# Patient Record
Sex: Male | Born: 1975 | Race: White | Hispanic: No | Marital: Married | Smoking: Never smoker
Health system: Southern US, Community
[De-identification: ages and names within clinical notes are randomized; demographics above are authoritative.]

## PROBLEM LIST (undated history)

## (undated) DIAGNOSIS — K219 Gastro-esophageal reflux disease without esophagitis: Secondary | ICD-10-CM

## (undated) DIAGNOSIS — F419 Anxiety disorder, unspecified: Secondary | ICD-10-CM

## (undated) DIAGNOSIS — E669 Obesity, unspecified: Secondary | ICD-10-CM

## (undated) DIAGNOSIS — G629 Polyneuropathy, unspecified: Secondary | ICD-10-CM

## (undated) DIAGNOSIS — Z87442 Personal history of urinary calculi: Secondary | ICD-10-CM

## (undated) DIAGNOSIS — G4730 Sleep apnea, unspecified: Secondary | ICD-10-CM

## (undated) DIAGNOSIS — K760 Fatty (change of) liver, not elsewhere classified: Secondary | ICD-10-CM

## (undated) DIAGNOSIS — K649 Unspecified hemorrhoids: Secondary | ICD-10-CM

## (undated) DIAGNOSIS — E119 Type 2 diabetes mellitus without complications: Secondary | ICD-10-CM

## (undated) HISTORY — DX: Unspecified hemorrhoids: K64.9

## (undated) HISTORY — DX: Sleep apnea, unspecified: G47.30

## (undated) HISTORY — DX: Anxiety disorder, unspecified: F41.9

## (undated) HISTORY — PX: HERNIA REPAIR: SHX51

## (undated) HISTORY — DX: Type 2 diabetes mellitus without complications: E11.9

## (undated) HISTORY — DX: Obesity, unspecified: E66.9

## (undated) HISTORY — DX: Gastro-esophageal reflux disease without esophagitis: K21.9

## (undated) HISTORY — PX: ANAL FISSURE REPAIR: SHX2312

## (undated) HISTORY — PX: APPENDECTOMY: SHX54

---

## 2014-07-17 ENCOUNTER — Ambulatory Visit: Payer: Self-pay | Admitting: Nutrition

## 2014-09-01 ENCOUNTER — Encounter: Payer: Self-pay | Admitting: Nutrition

## 2016-08-23 DIAGNOSIS — Z6841 Body Mass Index (BMI) 40.0 and over, adult: Secondary | ICD-10-CM | POA: Diagnosis not present

## 2016-08-23 DIAGNOSIS — E119 Type 2 diabetes mellitus without complications: Secondary | ICD-10-CM | POA: Diagnosis not present

## 2017-04-06 DIAGNOSIS — Z6841 Body Mass Index (BMI) 40.0 and over, adult: Secondary | ICD-10-CM | POA: Diagnosis not present

## 2017-04-06 DIAGNOSIS — Z0000 Encounter for general adult medical examination without abnormal findings: Secondary | ICD-10-CM | POA: Diagnosis not present

## 2017-05-05 DIAGNOSIS — R110 Nausea: Secondary | ICD-10-CM | POA: Diagnosis not present

## 2017-05-05 DIAGNOSIS — Z79899 Other long term (current) drug therapy: Secondary | ICD-10-CM | POA: Diagnosis not present

## 2017-05-05 DIAGNOSIS — R1031 Right lower quadrant pain: Secondary | ICD-10-CM | POA: Diagnosis not present

## 2017-05-05 DIAGNOSIS — Z7984 Long term (current) use of oral hypoglycemic drugs: Secondary | ICD-10-CM | POA: Diagnosis not present

## 2017-05-05 DIAGNOSIS — F329 Major depressive disorder, single episode, unspecified: Secondary | ICD-10-CM | POA: Diagnosis not present

## 2017-05-05 DIAGNOSIS — Z87442 Personal history of urinary calculi: Secondary | ICD-10-CM | POA: Diagnosis not present

## 2017-05-05 DIAGNOSIS — Z6841 Body Mass Index (BMI) 40.0 and over, adult: Secondary | ICD-10-CM | POA: Diagnosis not present

## 2017-05-05 DIAGNOSIS — E119 Type 2 diabetes mellitus without complications: Secondary | ICD-10-CM | POA: Diagnosis not present

## 2017-05-05 DIAGNOSIS — K3580 Unspecified acute appendicitis: Secondary | ICD-10-CM | POA: Diagnosis not present

## 2017-07-06 DIAGNOSIS — S161XXA Strain of muscle, fascia and tendon at neck level, initial encounter: Secondary | ICD-10-CM | POA: Diagnosis not present

## 2017-07-06 DIAGNOSIS — M436 Torticollis: Secondary | ICD-10-CM | POA: Diagnosis not present

## 2017-07-06 DIAGNOSIS — Z6841 Body Mass Index (BMI) 40.0 and over, adult: Secondary | ICD-10-CM | POA: Diagnosis not present

## 2017-07-10 DIAGNOSIS — S134XXA Sprain of ligaments of cervical spine, initial encounter: Secondary | ICD-10-CM | POA: Diagnosis not present

## 2017-07-10 DIAGNOSIS — M546 Pain in thoracic spine: Secondary | ICD-10-CM | POA: Diagnosis not present

## 2017-07-11 DIAGNOSIS — M546 Pain in thoracic spine: Secondary | ICD-10-CM | POA: Diagnosis not present

## 2017-07-11 DIAGNOSIS — S134XXA Sprain of ligaments of cervical spine, initial encounter: Secondary | ICD-10-CM | POA: Diagnosis not present

## 2017-07-13 DIAGNOSIS — S134XXA Sprain of ligaments of cervical spine, initial encounter: Secondary | ICD-10-CM | POA: Diagnosis not present

## 2017-07-13 DIAGNOSIS — M546 Pain in thoracic spine: Secondary | ICD-10-CM | POA: Diagnosis not present

## 2017-07-19 DIAGNOSIS — M5412 Radiculopathy, cervical region: Secondary | ICD-10-CM | POA: Diagnosis not present

## 2017-07-19 DIAGNOSIS — Z6841 Body Mass Index (BMI) 40.0 and over, adult: Secondary | ICD-10-CM | POA: Diagnosis not present

## 2017-07-19 DIAGNOSIS — M542 Cervicalgia: Secondary | ICD-10-CM | POA: Diagnosis not present

## 2017-08-15 DIAGNOSIS — M5412 Radiculopathy, cervical region: Secondary | ICD-10-CM | POA: Diagnosis not present

## 2017-08-15 DIAGNOSIS — M542 Cervicalgia: Secondary | ICD-10-CM | POA: Diagnosis not present

## 2018-05-18 DIAGNOSIS — Z6841 Body Mass Index (BMI) 40.0 and over, adult: Secondary | ICD-10-CM | POA: Diagnosis not present

## 2018-05-18 DIAGNOSIS — Z0000 Encounter for general adult medical examination without abnormal findings: Secondary | ICD-10-CM | POA: Diagnosis not present

## 2018-05-18 DIAGNOSIS — F411 Generalized anxiety disorder: Secondary | ICD-10-CM | POA: Diagnosis not present

## 2018-05-18 DIAGNOSIS — E119 Type 2 diabetes mellitus without complications: Secondary | ICD-10-CM | POA: Diagnosis not present

## 2018-06-21 DIAGNOSIS — Z23 Encounter for immunization: Secondary | ICD-10-CM | POA: Diagnosis not present

## 2018-10-11 ENCOUNTER — Other Ambulatory Visit (HOSPITAL_COMMUNITY): Payer: Self-pay | Admitting: Surgery

## 2018-10-19 ENCOUNTER — Other Ambulatory Visit: Payer: Self-pay

## 2018-10-19 ENCOUNTER — Ambulatory Visit (HOSPITAL_COMMUNITY)
Admission: RE | Admit: 2018-10-19 | Discharge: 2018-10-19 | Disposition: A | Discharge status: Home / Self Care | Payer: BLUE CROSS/BLUE SHIELD | Source: Ambulatory Visit | Attending: Surgery | Admitting: Surgery

## 2018-10-19 DIAGNOSIS — Z01818 Encounter for other preprocedural examination: Secondary | ICD-10-CM | POA: Diagnosis not present

## 2018-10-19 DIAGNOSIS — K219 Gastro-esophageal reflux disease without esophagitis: Secondary | ICD-10-CM | POA: Diagnosis not present

## 2018-10-30 ENCOUNTER — Encounter: Payer: BLUE CROSS/BLUE SHIELD | Attending: Surgery | Admitting: Dietician

## 2018-10-30 ENCOUNTER — Encounter: Payer: Self-pay | Admitting: Dietician

## 2018-10-30 VITALS — Ht 71.0 in | Wt 301.9 lb

## 2018-10-30 DIAGNOSIS — E119 Type 2 diabetes mellitus without complications: Secondary | ICD-10-CM | POA: Insufficient documentation

## 2018-10-30 DIAGNOSIS — E669 Obesity, unspecified: Secondary | ICD-10-CM | POA: Diagnosis not present

## 2018-10-30 DIAGNOSIS — G4730 Sleep apnea, unspecified: Secondary | ICD-10-CM | POA: Insufficient documentation
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
## 2018-10-30 NOTE — Patient Instructions (Signed)
Begin working through the [employer] discussed today, starting with:   Limit caffeinated beverages (coffee, tea, soda, energy drinks, etc.) - start cutting back slowly, replacing them with water or flavored water (use sugar-free flavorings, such as Crystal Light for example)   See you next month for your 1st SWL appointment!

## 2018-11-16 DIAGNOSIS — Z6841 Body Mass Index (BMI) 40.0 and over, adult: Secondary | ICD-10-CM | POA: Diagnosis not present

## 2018-11-16 DIAGNOSIS — F411 Generalized anxiety disorder: Secondary | ICD-10-CM | POA: Diagnosis not present

## 2018-11-16 DIAGNOSIS — E119 Type 2 diabetes mellitus without complications: Secondary | ICD-10-CM | POA: Diagnosis not present

## 2018-11-30 ENCOUNTER — Other Ambulatory Visit: Payer: Self-pay

## 2018-11-30 ENCOUNTER — Encounter: Payer: Self-pay | Admitting: Dietician

## 2018-11-30 ENCOUNTER — Encounter: Payer: BLUE CROSS/BLUE SHIELD | Attending: Surgery | Admitting: Dietician

## 2018-11-30 VITALS — Wt 300.5 lb

## 2018-11-30 DIAGNOSIS — E669 Obesity, unspecified: Secondary | ICD-10-CM | POA: Diagnosis not present

## 2018-11-30 NOTE — Patient Instructions (Addendum)
Continue to work on the [employer], focusing on the following this month:   Limit carbonated beverages, including sodas, diet sodas, sparkling water, etc.   Try adding in a protein shake sometime during the day, such as for breakfast or a snack.
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
## 2018-12-11 ENCOUNTER — Telehealth: Payer: Self-pay

## 2018-12-11 NOTE — Telephone Encounter (Signed)
Due to current COVID 19 pandemic, our office is severely reducing in office visits for at least the next 2 weeks, in order to minimize the risk to our patients and healthcare providers.   I called pt and discussed this with him. He is agreeable to a virtual visit at his appt time on Thursday.  Pt understands that although there may be some limitations with this type of visit, we will take all precautions to reduce any security or privacy concerns.  Pt understands that this will be treated like an in office visit and we will file with pt's insurance, and there may be a patient responsible charge related to this service.  Pt's email is [email or web address]. Pt understands that the cisco webex software must be downloaded and operational on the device pt plans to use for the visit.  Pt reports that his neck size is 18'' because that is his collared shirt size.  Pt is 291 lbs and is 5'10.  Pt's meds, allergies, and PMH were updated.  Pt has never had a sleep study but does endorse snoring.  Epworth Sleepiness Scale 0= would never doze 1= slight chance of dozing 2= moderate chance of dozing 3= high chance of dozing  Sitting and reading: 1 Watching TV: 1 Sitting inactive in a public place (ex. Theater or meeting): 0 As a passenger in a car for an hour without a break: 0 Lying down to rest in the afternoon: 2 Sitting and talking to someone: 0 Sitting quietly after lunch (no alcohol): 1 In a car, while stopped in traffic: 0 Total: 5  FSS: 43

## 2018-12-13 ENCOUNTER — Encounter: Payer: Self-pay | Admitting: Neurology

## 2018-12-13 ENCOUNTER — Ambulatory Visit (INDEPENDENT_AMBULATORY_CARE_PROVIDER_SITE_OTHER): Payer: BLUE CROSS/BLUE SHIELD | Admitting: Neurology

## 2018-12-13 ENCOUNTER — Other Ambulatory Visit: Payer: Self-pay

## 2018-12-13 DIAGNOSIS — R351 Nocturia: Secondary | ICD-10-CM

## 2018-12-13 DIAGNOSIS — R519 Headache, unspecified: Secondary | ICD-10-CM

## 2018-12-13 DIAGNOSIS — R0681 Apnea, not elsewhere classified: Secondary | ICD-10-CM

## 2018-12-13 DIAGNOSIS — R0683 Snoring: Secondary | ICD-10-CM

## 2018-12-13 DIAGNOSIS — Z6841 Body Mass Index (BMI) 40.0 and over, adult: Secondary | ICD-10-CM

## 2018-12-13 DIAGNOSIS — R51 Headache: Secondary | ICD-10-CM

## 2018-12-13 NOTE — Progress Notes (Signed)
Huston Foley, MD, PhD [facility] [address], [address] [address] [place], [place] [address]   Virtual Visit via Video Note on 12/13/2018:  I connected with Malik Ramos on 12/13/18 at  4:00 PM EDT by a video enabled telemedicine application and verified that I am speaking with the correct person using two identifiers.   I discussed the limitations of evaluation and management by telemedicine and the availability of in person appointments. The patient expressed understanding and agreed to proceed.  History of Present Illness:  Malik Ramos is a 43 -year-old right-handed gentleman with an underlying medical history of type 2 diabetes, neuropathy, and morbid obesity with a BMI of over 40, with whom I am conducting a virtual, video based new patient visit via WebEx today to discuss his sleep disorder, in particular, concern for underlying obstructive sleep apnea. The patient is unaccompanied today and joins via I pad from home. The patient is referred by Dr. Twana First, general surgeon and is being evaluated for bariatric surgery. I reviewed Dr. Eustaquio Boyden office note from 10/05/2018. His Epworth sleepiness score is 5 out of 24, fatigue severity score is 43 out of 63. The most recent vital signs on file were from 10/05/2018 from Dr. Eustaquio Boyden office note: Blood pressure 158/88, pulse of 111, weight 309 pounds with a BMI of 43.73. Weight on 11/30/2018 was 300.5 pounds for a BMI of 41.91. Neck circumference by self report: 18.5 inches.  He has had witnessed apneas previously per wifes report when he weighed more. His maximum weight was 340 pounds. He is not aware of any family history of OSA, denies any leg edema, denies restless leg symptoms. He has nocturia about once per average night. He does snore. He has had rare morning headaches. He had no tonsillectomy. He drinks caffeine in the form of soda, diet soda typically, 3-4 16 ounce bottles per day on average. He is a  nonsmoker and does not currently utilize any alcohol. 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 Bedtime is generally between 10 and 10:30, rise time around 7:30. They have 2 cats in the household, not in their bedroom typically. He does not have a TV in the bedroom. His most recent A1c was around 11, he has restarted another diabetes medication and has a checkup pending with primary care.  His Past Medical History Is Significant For: Past Medical History:  Diagnosis Date   Anxiety    GERD (gastroesophageal reflux disease)    Hemorrhoids    Obesity    Obesity    Sleep apnea    Type 2 diabetes mellitus (HCC)     His Past Surgical History Is Significant For: Past Surgical History:  Procedure Laterality Date   APPENDECTOMY      His Family History Is Significant For: Family History  Problem Relation Age of Onset   Hypertension Mother     His Social History Is Significant For: Social History   Socioeconomic History   Marital status: Married    Spouse name: Not on file   Number of children: 4   Years of education: Not on file   Highest education level: Not on file  Occupational History   Not on file  Social Needs   Financial resource strain: Not on file   Food insecurity:    Worry: Not on file    Inability: Not on file   Transportation needs:  Medical: Not on file    Non-medical: Not on file  Tobacco Use   Smoking status: Never Smoker   Smokeless tobacco: Never Used  Substance and Sexual Activity   Alcohol use: Never    Frequency: Never   Drug use: Not on file   Sexual activity: Not on file  Lifestyle   Physical activity:    Days per week: Not on file    Minutes per session: Not on file   Stress: Not on file  Relationships   Social connections:    Talks on phone: Not on file    Gets together: Not on file    Attends religious service: Not on file    Active member of club or  organization: Not on file    Attends meetings of clubs or organizations: Not on file    Relationship status: Not on file  Other Topics Concern   Not on file  Social History Narrative   3-4 diet sodas daily   R handed    His Allergies Are:  No Known Allergies:   His Current Medications Are:  Outpatient Encounter Medications as of 12/13/2018  Medication Sig   busPIRone (BUSPAR) 5 MG tablet Take 5 mg by mouth 2 (two) times daily.   lisinopril (PRINIVIL,ZESTRIL) 2.5 MG tablet Take 2.5 mg by mouth daily.   metFORMIN (GLUCOPHAGE-XR) 500 MG 24 hr tablet Take 500 mg by mouth 2 (two) times daily.   OMEPRAZOLE PO Take by mouth.   PARoxetine (PAXIL) 40 MG tablet Take 40 mg by mouth daily.   No facility-administered encounter medications on file as of 12/13/2018.   :   Review of Systems:  Out of a complete 14 point review of systems, all are reviewed and negative with the exception of these symptoms as listed below:  Observations/Objective: The most recent vital signs available for my review are from 10/05/2018: Blood pressure 158/88, pulse 111, temperature 97.8, weight 309 pounds for BMI of 43.73. Patient is pleasant, conversant, no acute distress. Neck circumference by self report is 18.5 inches. Airway examination reveals a Mallampati class II, tonsils are not fully visualized but seem to be on the smaller side, uvula could be on the larger side, tongue protrudes centrally, palate elevates symmetrically. Face is symmetric, speech is clear and not dysarthric, no voice tremor, no lip, neck or jaw tremor, extraocular movements are grossly intact. He is able to use his upper extremities freely and without problems, fine motor skills generally preserved.  Assessment and Plan: In summary, Malik Ramos is a very pleasant 43 y.o.-year old male with an underlying medical history of type 2 diabetes, neuropathy, and morbid obesity with a BMI of over 40, with whom I am conducting a virtual, video  based new patient visit via WebEx today to discuss his sleep disorder, in particular, concern for underlying obstructive sleep apnea. His history and physical examination does raise concern for underlying obstructive sleep apnea. He is being evaluated for bariatric surgery. I talked to the patient about the diagnosis of OSA, its prognosis and treatment options. We talked about the risks and ramifications of untreated OSA, in particular with respect to developing cardiovascular and cerebrovascular complications down the road. Realistically speaking a AutoPap her CPAP would be the treatment of choice right now. He would be willing to consider treatment with a positive airway pressure device. I suggest we proceed with home sleep testing and our sleep lab should be in touch soon regarding setting up home testing. I answered all  his questions today and the patient was in agreement. I plan to see him back after testing is completed.  Huston Foley, MD, PhD  Follow Up Instructions:  1. Home sleep testing.  2. Consider AutoPap therapy if OSA is confirmed. 3. Follow-up post testing.   I discussed the assessment and treatment plan with the patient. The patient was provided an opportunity to ask questions and all were answered. The patient agreed with the plan and demonstrated an understanding of the instructions.   The patient was advised to call back or seek an in-person evaluation if the symptoms worsen or if the condition fails to improve as anticipated.  I provided 30 minutes of non-face-to-face time during this encounter, including Electronic and paper chart review.   Huston Foley, MD

## 2018-12-13 NOTE — Patient Instructions (Signed)
Given verbally, during today's virtual video-based encounter, with verbal feedback received.   

## 2018-12-19 ENCOUNTER — Ambulatory Visit (INDEPENDENT_AMBULATORY_CARE_PROVIDER_SITE_OTHER): Payer: BLUE CROSS/BLUE SHIELD | Admitting: Neurology

## 2018-12-19 ENCOUNTER — Other Ambulatory Visit: Payer: Self-pay

## 2018-12-19 DIAGNOSIS — G4733 Obstructive sleep apnea (adult) (pediatric): Secondary | ICD-10-CM

## 2018-12-19 DIAGNOSIS — Z6841 Body Mass Index (BMI) 40.0 and over, adult: Secondary | ICD-10-CM

## 2018-12-19 DIAGNOSIS — R351 Nocturia: Secondary | ICD-10-CM

## 2018-12-19 DIAGNOSIS — R0683 Snoring: Secondary | ICD-10-CM

## 2018-12-19 DIAGNOSIS — R519 Headache, unspecified: Secondary | ICD-10-CM

## 2018-12-19 DIAGNOSIS — R0681 Apnea, not elsewhere classified: Secondary | ICD-10-CM

## 2018-12-19 DIAGNOSIS — R51 Headache: Secondary | ICD-10-CM

## 2019-01-07 ENCOUNTER — Telehealth: Payer: Self-pay

## 2019-01-07 NOTE — Progress Notes (Signed)
Patient referred by Dr. Fredricka Bonine, seen virtually by me on 12/13/18, HST on 12/27/18.    Please call and notify the patient that the recent home sleep test showed obstructive sleep apnea in the moderate severe range. While I recommend treatment for this in the form CPAP, we are not yet bringing patients in for in-lab testing for CPAP titration studies, due to the virus pandemic; therefore, I suggest we start him on autoPAP at home, which means, that we don't have to bring him in for a sleep study with CPAP, but will let him start using an autoPAP machine at home, through a DME company (of patient's choice, or as per insurance requirement, as per in [employer], if there are such restrictions, depending on insurance carrier). The DME representative will educate the patient on how to use the machine, how to put the mask on, etc. I have placed an order in the chart. Please send referral, talk to patient, send report to referring MD. We will need a FU in sleep clinic for 10 weeks post-PAP set up, please arrange that with me or one of our NPs.  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 For concerns and questions about how to clean the PAP machine and the supplies and how frequently to change the hose, mask and filters, etc., patient can call the DME company, for more information, education and troubleshooting. Especially in the current situation, I recommend, patients be extra mindful about hand hygiene, handling the PAP equipment only with clean hands, wipe the mask daily, keep little one and four-legged companions (and any other pets for that matter) away from the machine and mask at all times.    Thanks,    Huston Foley, MD, PhD [facility])

## 2019-01-07 NOTE — Telephone Encounter (Signed)
I called pt. I advised pt that Dr. Frances Furbish reviewed their sleep study results and found that pt has moderate to severe osa. Dr. Frances Furbish recommends that pt start an auto pap at home since we are not able to do cpap titrations in light of virus pandemic. I reviewed PAP compliance expectations with the pt. Pt is agreeable to starting an auto-PAP. I advised pt that an order will be sent to a DME, [facility], and [facility] will call the pt within about one week after they file with the pt's insurance. [facility] will show the pt how to use the machine, fit for masks, and troubleshoot the auto-PAP if needed. A follow up appt was made for insurance purposes with Dr. Frances Furbish on 04/02/19 at 3:00pm. Pt verbalized understanding to arrive 15 minutes early and bring their auto-PAP. A letter with all of this information in it will be mailed to the pt as a reminder. I verified with the pt that the address we have on file is correct. Pt verbalized understanding of results. Pt had no questions at this time but was encouraged to call back if questions arise. I have sent the order to [facility] and have received confirmation that they have received the order.

## 2019-01-07 NOTE — Telephone Encounter (Signed)
-----   Message from Huston Foley, MD sent at 01/07/2019  8:42 AM EDT ----- Patient referred by Dr. Fredricka Bonine, seen virtually by me on 12/13/18, HST on 12/27/18.    Please call and notify the patient that the recent home sleep test showed obstructive sleep apnea in the moderate severe range. While I recommend treatment for this in the form CPAP, we are not yet bringing patients in for in-lab testing for CPAP titration studies, due to the virus pandemic; therefore, I suggest we start him on autoPAP at home, which means, that we don't have to bring him in for a sleep study with CPAP, but will let him start using an autoPAP machine at home, through a DME company (of patient's choice, or as per insurance requirement, as per in [employer], if there are such restrictions, depending on insurance carrier). The DME representative will educate the patient on how to use the machine, how to put the mask on, etc. I have placed an order in the chart. Please send referral, talk to patient, send report to referring MD. We will need a FU in sleep clinic for 10 weeks post-PAP set up, please arrange that with me or one of our NPs.  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 For concerns and questions about how to clean the PAP machine and the supplies and how frequently to change the hose, mask and filters, etc., patient can call the DME company, for more information, education and troubleshooting. Especially in the current situation, I recommend, patients be extra mindful about hand hygiene, handling the PAP equipment only with clean hands, wipe the mask daily, keep little one and four-legged companions (and any other pets for that  matter) away from the machine and mask at all times.     Thanks,   Huston Foley, MD, PhD [facility])

## 2019-01-07 NOTE — Addendum Note (Signed)
Addended by: Huston Foley on: 01/07/2019 08:42 AM   Modules accepted: Orders

## 2019-01-07 NOTE — Procedures (Signed)
Patient Information     First Name: Malik BootyJoshua Last Name: [name] ID: [id number]  Birth Date: Sep 01, 1976 Age: 43 Gender: Male  Referring Doctor: Lovey NewcomerBoyd, William S, PA BMI: 42.0 (W=300 lb, H=5' 11'')  Reading Doctor: Neck Circ: Huston FoleySaima Marieli Rudy, MD  18.5 Epworth:  5/24 FSS: 61/63  Sleep Study Information    Study Date: Dec 27, 2018 S/H/A Version: 5.1.76.4 / 4.1.1531 / 5376  History: 43 year old man with a history of type 2 diabetes, neuropathy, and morbid obesity, who is at risk for obstructive sleep apnea. The patient is being evaluated for bariatric surgery.            Diagnosis: OSA             Recommendations:  This home sleep test demonstrates moderate obstructive sleep apnea with a total AHI of 21/hour and O2 nadir of 87%. Treatment with positive airway pressure - in the form of CPAP - is recommended. This will require, ideally, require a full night CPAP titration study for proper treatment settings, O2 monitoring and mask fitting. Based on the severity of the sleep disordered breathing, an attended titration study is indicated. However, under the current circumstances (i.e. the COVID-19 pandemic), in order to ensure ongoing good care and for the safety of the patient, she will be advised to proceed with an autoPAP titration/trial at home. A proper titration study with CPAP may be helpful or needed down the road, when considered safe. Please note, that untreated obstructive sleep apnea may carry additional perioperative morbidity. Patients with significant obstructive sleep apnea should receive perioperative PAP therapy and the surgeons and particularly the anesthesiologist should be informed of the diagnosis and the severity of the sleep disordered breathing. Patient will be reminded regarding compliance with his PAP machine and to be mindful of cleanliness with the equipment and timely with supply changes (i.e. changing filter, mask, hose, humidifier chamber on an ongoing basis as recommended, and  cleaning parts that touch the face and nose daily, etc). The patient should be cautioned not to drive, work at heights, or operate dangerous or heavy equipment when tired or sleepy. Review and reiteration of good sleep hygiene measures should be pursued with any patient. Other causes of the patient's symptoms, including circadian rhythm disturbances, an underlying mood disorder, medication effect and/or an underlying medical problem cannot be ruled out based on this test. Clinical correlation is recommended. The patient and his referring provider will be notified of the test results. The patient will [facility], either for a face-to-face or virtual visit, whichever feasible and recommended at the time.  I certify that I have reviewed the raw data recording prior to the issuance of this report in accordance with the standards of the American Academy of Sleep Medicine (AASM).  Huston FoleySaima Hanadi Stanly, MD, PhD [facility] Diplomat, ABPN (Neurology and Sleep)                Sleep Summary  Oxygen Saturation Statistics   Start Study Time: End Study Time: Total Recording Time:  10:18:27 PM 8:18:27 AM 10 h, 0 min  Total Sleep Time % REM of Sleep Time:  8 h, 32 min  18.4%    Mean: 96  Minimum: 87  Maximum: 99   Mean of Desaturations [place]irs (%):92     Oxygen Desaturation %:   4-9 10-20 >20 Total  Events Number Total    83  5 94.3 5.7  0 0.0  88 100.0  Oxygen Saturation: <90 <=88 <85 <80 <70  Duration (minutes): Sleep % 1.0 0.2  0.4 0.0  0.1 0.0 0.0 0.0 0.0 0.0     Respiratory Indices      Total Events REM NREM All Night  pRDI:  223  pAHI:  179 ODI:  88  pAHIc:  10  % CSR: 0.0 34.5 32.0 16.0 1.9 24.3 18.6 9.1 1.0 26.2 21.0 10.3 1.2       Pulse Rate Statistics during Sleep (BPM)      Mean: 73 Minimum: 54 Maximum: 107    Indices are calculated using technically valid sleep time of  8 hrs, 30 min. pRDI/pAHI  are calculated using oxi desaturations ? 3%  Body Position Statistics  Position Supine Prone Right Left Non-Supine  Sleep (min) 67.5 47.0 307.5 90.5 445.0  Sleep % 13.2 9.2 60.0 17.7 86.8  pRDI 29.4 23.1 24.9 29.9 25.7  pAHI 25.0 14.1 19.0 28.6 20.4  ODI 12.5 5.1 8.6 17.3 10.0     Snoring Statistics Snoring Level (dB) >40 >50 >60 >70 >80 >Threshold (45)  Sleep (min) 281.2 91.9 8.8 0.0 0.0 156.8  Sleep % 54.9 17.9 1.7 0.0 0.0 30.6    Mean: 44 dB Sleep Stages Chart                                pAHI=21.0                                             Mild              Moderate                    Severe                                                    5              15                    30* Reference values are according to AASM guidelines

## 2019-01-10 ENCOUNTER — Encounter: Payer: BLUE CROSS/BLUE SHIELD | Attending: Surgery | Admitting: Skilled Nursing Facility1

## 2019-01-10 ENCOUNTER — Other Ambulatory Visit: Payer: Self-pay

## 2019-01-10 DIAGNOSIS — E119 Type 2 diabetes mellitus without complications: Secondary | ICD-10-CM

## 2019-01-10 DIAGNOSIS — E669 Obesity, unspecified: Secondary | ICD-10-CM | POA: Insufficient documentation
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
## 2019-02-07 ENCOUNTER — Ambulatory Visit: Payer: BLUE CROSS/BLUE SHIELD | Admitting: Skilled Nursing Facility1

## 2019-02-13 ENCOUNTER — Encounter: Payer: BC Managed Care – PPO | Attending: Surgery | Admitting: Skilled Nursing Facility1

## 2019-02-13 ENCOUNTER — Other Ambulatory Visit: Payer: Self-pay

## 2019-02-13 DIAGNOSIS — E669 Obesity, unspecified: Secondary | ICD-10-CM | POA: Insufficient documentation
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
## 2019-02-13 NOTE — Patient Instructions (Signed)
-  Log everything you eat and drink and the amount: if you bite it write, if you drink it, ink it and look back for connections with blood sugars   -

## 2019-03-13 ENCOUNTER — Other Ambulatory Visit: Payer: Self-pay

## 2019-03-13 ENCOUNTER — Encounter: Payer: BC Managed Care – PPO | Attending: Surgery | Admitting: Skilled Nursing Facility1

## 2019-03-13 DIAGNOSIS — E119 Type 2 diabetes mellitus without complications: Secondary | ICD-10-CM

## 2019-03-13 DIAGNOSIS — E669 Obesity, unspecified: Secondary | ICD-10-CM | POA: Diagnosis not present
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
## 2019-03-14 DIAGNOSIS — F509 Eating disorder, unspecified: Secondary | ICD-10-CM | POA: Diagnosis not present

## 2019-03-25 DIAGNOSIS — E119 Type 2 diabetes mellitus without complications: Secondary | ICD-10-CM | POA: Diagnosis not present

## 2019-03-25 DIAGNOSIS — K219 Gastro-esophageal reflux disease without esophagitis: Secondary | ICD-10-CM | POA: Diagnosis not present

## 2019-03-25 DIAGNOSIS — R945 Abnormal results of liver function studies: Secondary | ICD-10-CM | POA: Diagnosis not present

## 2019-03-27 DIAGNOSIS — F411 Generalized anxiety disorder: Secondary | ICD-10-CM | POA: Diagnosis not present

## 2019-03-27 DIAGNOSIS — Z6841 Body Mass Index (BMI) 40.0 and over, adult: Secondary | ICD-10-CM | POA: Diagnosis not present

## 2019-03-27 DIAGNOSIS — E782 Mixed hyperlipidemia: Secondary | ICD-10-CM | POA: Diagnosis not present

## 2019-03-27 DIAGNOSIS — E119 Type 2 diabetes mellitus without complications: Secondary | ICD-10-CM | POA: Diagnosis not present

## 2019-03-28 ENCOUNTER — Ambulatory Visit: Payer: BLUE CROSS/BLUE SHIELD | Admitting: Psychiatry

## 2019-04-02 ENCOUNTER — Ambulatory Visit: Payer: Self-pay | Admitting: Neurology

## 2019-04-15 ENCOUNTER — Encounter: Payer: BC Managed Care – PPO | Attending: Surgery | Admitting: Skilled Nursing Facility1

## 2019-04-15 ENCOUNTER — Other Ambulatory Visit: Payer: Self-pay

## 2019-04-15 ENCOUNTER — Ambulatory Visit: Payer: BC Managed Care – PPO | Admitting: Skilled Nursing Facility1

## 2019-04-15 DIAGNOSIS — E669 Obesity, unspecified: Secondary | ICD-10-CM | POA: Insufficient documentation

## 2019-04-15 DIAGNOSIS — E119 Type 2 diabetes mellitus without complications: Secondary | ICD-10-CM
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
## 2019-05-14 ENCOUNTER — Other Ambulatory Visit: Payer: Self-pay

## 2019-05-14 ENCOUNTER — Encounter: Payer: BC Managed Care – PPO | Attending: Surgery | Admitting: Skilled Nursing Facility1

## 2019-05-14 DIAGNOSIS — E669 Obesity, unspecified: Secondary | ICD-10-CM | POA: Diagnosis not present
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
## 2019-06-07 ENCOUNTER — Ambulatory Visit: Payer: Self-pay | Admitting: Surgery

## 2019-06-07 NOTE — H&P (Signed)
Surgical H&P  CC: obesity  HPI: Follows up today for surgical management of morbid obesity. We initially met in January of this year. He reports no major changes in his health. He has completed 6 months of supervised weight loss and is ready to proceed with surgery. He reports that his meetings with the dietitians have been very helpful for him, and states that he has started to build a framework mentally as far as developing new habits and behavior patterns that can help him sustain success in the future. He has completed the remainder of the bariatric pathway without any obstacles identified. Approved by psychology. Upper GI showed no hiatal hernia although did demonstrate mild reflux. Chest x-ray normal. Labwork unremarkable except for hemoglobin A1c of 8.6 which is aware. He did undergo evaluation for sleep apnea and does have moderate to severe obstructive sleep apnea.  Initial visit 10/08/18: 43yo male presents to discuss bariatric surgery. He has been overweight for essentially his entire life. When he was in his 35s, he weighed approximately 225 pounds but was very athletic. He has continued to gain weight over the years. He has tried numerous different diets and notes that he is successfully stopped losing some weight, however after the diet and see regained that weight that he lost plus more. He is affected by his obesity and many ways- he does have type 2 diabetes which is well controlled with metformin. He does have reflux for which he takes over-the-counter medication. 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 He does admit that he has a significant rent emotional relationship to food, he states that this is his left leg which, in a big part of how he enjoys his life. He does express that he understands he will need to let that go and change  that behavior although admittedly will be difficult.  He is married, with 4 children ages 17, 18, 52, 27. He feels that he has a good support system to go through bariatric surgery. Works as Copy at [employer]. Denies tobacco or drug use, drinks very occasionally a small amount.  Wt 309.13lb, BMI 43.73. He is a good candidate for sleeve gastrectomy. We discussed the surgery including technical aspects, the risks of bleeding, infection, pain, scarring, injury to intra-abdominal structures, staple line leak or abscess, chronic abdominal pain or nausea, worsened GERD which on very rare occasions can require conversion to bypass, DVT/PE, pneumonia, heart attack, stroke, death, failure to reach weight loss goals and weight regain, hernia. Discussed the typical pre-, peri-, and postoperative course. Discussed the importance of lifelong behavioral changes to combat the chronic and relapsing disease which is obesity. It is very encouraging that he already understands and acknowledges and emotional relationship with food and is willing to work on changing that. We discussed starting by observing his behaviors and thoughts and noticing where he can start making changes. He had several very insightful questions which were answered to his satisfaction. Will start him on the bariatric pathway.  No Known Allergies  Past Medical History:  Diagnosis Date  . Anxiety   . GERD (gastroesophageal reflux disease)   . Hemorrhoids   . Obesity   . Obesity   . Sleep apnea   . Type 2 diabetes mellitus ([place])     Past Surgical History:  Procedure  Laterality Date  . APPENDECTOMY      Family History  Problem Relation Age of Onset  . Hypertension Mother     Social History   Socioeconomic History  . Marital status: Married    Spouse name: Not on file  . Number of children: 4  . Years of education: Not on file  . Highest education level: Not on file  Occupational History  . Not on  file  Social Needs  . Financial resource strain: Not on file  . Food insecurity    Worry: Not on file    Inability: Not on file  . Transportation needs    Medical: Not on file    Non-medical: Not on file  Tobacco Use  . Smoking status: Never Smoker  . Smokeless tobacco: Never Used  Substance and Sexual Activity  . Alcohol use: Never    Frequency: Never  . Drug use: Not on file  . Sexual activity: Not on file  Lifestyle  . Physical activity    Days per week: Not on file    Minutes per session: Not on file  . Stress: Not on file  Relationships  . Social Herbalist on phone: Not on file    Gets together: Not on file    Attends religious service: Not on file    Active member of club or organization: Not on file    Attends meetings of clubs or organizations: Not on file    Relationship status: Not on file  Other Topics Concern  . Not on file  Social History Narrative   3-4 diet sodas daily   R handed    Current Outpatient Medications on File Prior to Visit  Medication Sig Dispense Refill  . busPIRone (BUSPAR) 5 MG tablet Take 5 mg by mouth 2 (two) times daily.    Marland Kitchen lisinopril (PRINIVIL,ZESTRIL) 2.5 MG tablet Take 2.5 mg by mouth daily.    . metFORMIN (GLUCOPHAGE-XR) 500 MG 24 hr tablet Take 500 mg by mouth 2 (two) times daily.    Marland Kitchen OMEPRAZOLE PO Take by mouth.    Marland Kitchen PARoxetine (PAXIL) 40 MG tablet Take 40 mg by mouth daily.     No current facility-administered medications on file prior to visit.     Review of Systems: a complete, 10pt review of systems was completed with pertinent positives and negatives as documented in the HPI  Physical Exam: Vitals  Weight: 306.2 lb Height: 70in Body Surface Area: 2.5 m Body Mass Index: 43.93 kg/m  Temp.: 97.66F  Pulse: 114 (Regular)  P.OX: 93% (Room air) BP: 150/100 (Sitting, Left Arm, Standard)  Alert and well-appearing. Unlabored respirations. Extraocular movements intact.   No flowsheet data  found.  No flowsheet data found.  No results found for: INR, PROTIME  Imaging: No results found.   A/P: MORBID OBESITY (E66.01) Story: Remains an excellent candidate for sleeve gastrectomy and seems to have made good use of the counseling he received while meeting with the dietitians during the period of supervised weight loss. We went over again today the typical perioperative course and the risks of surgery. He had several insightful questions which were answered to his satisfaction. He has an excellent support network and is ready to proceed.   Romana Juniper, MD [facility], [place] Pager [phone]

## 2019-06-25 DIAGNOSIS — Z23 Encounter for immunization: Secondary | ICD-10-CM | POA: Diagnosis not present

## 2019-07-01 ENCOUNTER — Other Ambulatory Visit: Payer: Self-pay

## 2019-07-01 ENCOUNTER — Encounter: Payer: Self-pay | Admitting: Dietician

## 2019-07-01 ENCOUNTER — Encounter: Payer: BC Managed Care – PPO | Attending: Surgery | Admitting: Dietician

## 2019-07-01 DIAGNOSIS — E669 Obesity, unspecified: Secondary | ICD-10-CM | POA: Diagnosis not present

## 2019-07-01 NOTE — Progress Notes (Signed)
Pre-Operative Nutrition Class   Appt Start Time: 8:15am     End Time: 9:30am   Patient was seen on 07/01/2019 for Pre-Operative Bariatric Surgery Education at [facility].    Surgery date: 07/22/2019  Surgery type: Sleeve    Start weight at [facility]: 301.7 lbs (date: 10/30/2018) Weight today: 311 lbs BMI: 43.4 kg/m2   Samples Given per MNT Protocol (pt educated on appropriate usage) Multivitamin: Bariatric Advantage Lot #[id number] Exp: 09/2020   Calcium: Bariatric Fusion    Protein Drink: Protein2O Lot #[id number] CCP 0230  Exp: 10/29/2020   The following the learning objectives were met by the patient during this course:  Identify Pre-Op Dietary Goals and will begin 2 weeks pre-operatively  Identify appropriate sources of fluids and proteins   State protein recommendations and appropriate sources pre and post-operatively  Identify Post-Operative Dietary Goals and will follow for 2 weeks post-operatively  Identify appropriate multivitamin and calcium sources  Describe the need for physical activity post-operatively and will follow MD recommendations  State when to call healthcare provider regarding medication questions or post-operative complications   Handouts given include:  Pre-Op Bariatric Surgery Diet Handout  Protein Shake Handout  Post-Op Bariatric Surgery Nutrition Handout  [facility] Program Information Flyer  Support Group Information Flyer  [facility] Bariatric Supplements Price List   Follow-Up Plan: Patient will follow-up at [facility] 2 weeks post operatively for diet advancement per MD.

## 2019-07-17 NOTE — Patient Instructions (Addendum)
DUE TO COVID-19 ONLY ONE VISITOR IS ALLOWED TO COME WITH YOU AND STAY IN THE WAITING ROOM ONLY DURING PRE OP AND PROCEDURE DAY OF SURGERY. THE 1 VISITOR MAY VISIT WITH YOU AFTER SURGERY IN YOUR PRIVATE ROOM DURING VISITING HOURS ONLY!  YOU NEED TO HAVE A COVID 19 TEST ON Today, Immediately after pre op appointment, THIS TEST MUST BE DONE BEFORE SURGERY, COME  [address], [place]  , [id number].  [facility]) ONCE YOUR COVID TEST IS COMPLETED, PLEASE BEGIN THE QUARANTINE INSTRUCTIONS AS OUTLINED IN YOUR HANDOUT.                Malik Ramos     Your procedure is scheduled on: Tuesday 07/23/2019   Report to [facility] Main  Entrance    Report to admitting at 0715  AM   How to Manage Your Diabetes Before and After Surgery  Why is it important to control my blood sugar before and after surgery? . Improving blood sugar levels before and after surgery helps healing and can limit problems. . A way of improving blood sugar control is eating a healthy diet by: o  Eating less sugar and carbohydrates o  Increasing activity/exercise o  Talking with your doctor about reaching your blood sugar goals . High blood sugars (greater than 180 mg/dL) can raise your risk of infections and slow your recovery, so you will need to focus on controlling your diabetes during the weeks before surgery. . Make sure that the doctor who takes care of your diabetes knows about your planned surgery including the date and location.  How do I manage my blood sugar before surgery? . Check your blood sugar at least 4 times a day, starting 2 days before surgery, to make sure that the level is not too high or low. o Check your blood sugar the morning of your surgery when you wake up and every 2 hours until you get to the Short Stay unit. . If your blood sugar is less than 70 mg/dL, you will need to treat for low blood sugar: o Do not take insulin. o Treat a low blood sugar (less than 70  mg/dL) with  cup of clear juice (cranberry or apple), 4 glucose tablets, OR glucose gel. o Recheck blood sugar in 15 minutes after treatment (to make sure it is greater than 70 mg/dL). If your blood sugar is not greater than 70 mg/dL on recheck, call [phone] for further instructions. . Report your blood sugar to the short stay nurse when you get to Short Stay.  . If you are admitted to the hospital after surgery: o Your blood sugar will be checked by the staff and you will probably be given insulin after surgery (instead of oral diabetes medicines) to make sure you have good blood sugar levels. o The goal for blood sugar control after surgery is 80-180 mg/dL.   WHAT DO I DO ABOUT MY DIABETES MEDICATION?        The day before surgery, Take Metformin (Glucophage-XR) as prescribed!  . Do not take oral diabetes medicines (pills) the morning of surgery.        Call this number if you have problems the morning of surgery [phone]    NO SOLID FOOD AFTER 600 PM THE NIGHT BEFORE YOUR SURGERY.   MAY HAVE LIQUIDS UNTIL 6:15 AM DAY OF SURGERY   CLEAR LIQUID DIET  Foods Allowed  Foods Excluded  Water, Black Coffee and tea, regular and decaf                             liquids that you cannot  Plain Jell-O in any flavor  (No red)                                           see through such as: Fruit ices (not with fruit pulp)                                     milk, soups, orange juice  Iced Popsicles (No red)                                    All solid food Carbonated beverages, regular and diet                                    Apple juices Sports drinks like Gatorade (No red) Lightly seasoned clear broth or consume(fat free) Sugar, honey syrup  Sample Menu Breakfast                                Lunch                                     Supper Cranberry juice                    Beef broth                             Chicken broth Jell-O                                     Grape juice                           Apple juice Coffee or tea                        Jell-O                                      Popsicle                                                Coffee or tea                        Coffee or tea   MORNING OF SURGERY DRINK:   DRINK 1 G2 drink BEFORE YOU LEAVE HOME, DRINK ALL OF THE  G2 DRINK AT ONE TIME.    YOU MAY DRINK CLEAR FLUIDS. THE G2 DRINK YOU DRINK BEFORE YOU LEAVE HOME WILL BE THE LAST FLUIDS YOU DRINK BEFORE SURGERY.   Take these medicines the morning of surgery with A SIP OF WATER: none   DO NOT TAKE ANY DIABETIC MEDICATIONS DAY OF YOUR SURGERY!                               You may not have any metal on your body including hair pins and              piercings  Do not wear jewelry, make-up, lotions, powders or perfumes, deodorant                           Men may shave face and neck.   Do not bring valuables to the hospital. [facility] IS NOT             RESPONSIBLE   FOR VALUABLES.  Contacts, dentures or bridgework may not be worn into surgery.  Leave suitcase in the car. After surgery it may be brought to your room.   PAIN IS EXPECTED AFTER SURGERY AND WILL NOT BE COMPLETELY ELIMINATED. AMBULATION AND TYLENOL WILL HELP REDUCE  INCISIONAL AND GAS PAIN. MOVEMENT IS KEY!   YOU ARE EXPECTED TO BE OUT OF BED WITHIN 4 HOURS OF ADMISSION TO YOUR PATIENT ROOM.  SITTING IN THE RECLINER THROUGHOUT THE DAY IS IMPORTANT FOR DRINKING FLUIDS AND MOVING GAS THROUGHOUT THE GI TRACT.  COMPRESSION STOCKINGS SHOULD BE [facility] UNLESS YOU ARE WALKING.   INCENTIVE SPIROMETER SHOULD BE USED EVERY HOUR WHILE AWAKE TO DECREASE POST-OPERATIVE COMPLICATIONS SUCH AS PNEUMONIA.  WHEN DISCHARGED HOME, IT IS IMPORTANT TO CONTINUE TO WALK EVERY HOUR AND USE THE INCENTIVE SPIROMETER EVERY HOUR.            BRUSH YOUR TEETH MORNING OF SURGERY AND RINSE YOUR MOUTH OUT,  NO CHEWING GUM CANDY OR MINTS.     _____________________________________________________________________             [facility] Before surgery, you can play an important role.  Because skin is not sterile, your skin needs to be as free of germs as possible.  You can reduce the number of germs on your skin by washing with CHG (chlorahexidine gluconate) soap before surgery.  CHG is an antiseptic cleaner which kills germs and bonds with the skin to continue killing germs even after washing. Please DO NOT use if you have an allergy to CHG or antibacterial soaps.  If your skin becomes reddened/irritated stop using the CHG and inform your nurse when you arrive at Short Stay. Do not shave (including legs and underarms) for at least 48 hours prior to the first CHG shower.  You may shave your face/neck. Please follow these instructions carefully:  1.  Shower with CHG Soap the night before surgery and the  morning of Surgery.  2.  If you choose to wash your hair, wash your hair first as usual with your  normal  shampoo.  3.  After you shampoo, rinse your hair and body thoroughly to remove the  shampoo.                           4.  Use CHG as  you would any other liquid soap.  You can apply chg directly  to the skin and wash                       Gently with a scrungie or clean washcloth.  5.  Apply the CHG Soap to your body ONLY FROM THE NECK DOWN.   Do not use on face/ open                           Wound or open sores. Avoid contact with eyes, ears mouth and genitals (private parts).                       Wash face,  Genitals (private parts) with your normal soap.             6.  Wash thoroughly, paying special attention to the area where your surgery  will be performed.  7.  Thoroughly rinse your body with warm water from the neck down.  8.  DO NOT shower/wash with your normal soap after using and rinsing off  the CHG Soap.                9.  Pat yourself dry with a clean  towel.            10.  Wear clean pajamas.            11.  Place clean sheets on your bed the night of your first shower and do not  sleep with pets. Day of Surgery : Do not apply any lotions/deodorants the morning of surgery.  Please wear clean clothes to the hospital/surgery center.  FAILURE TO FOLLOW THESE INSTRUCTIONS MAY RESULT IN THE CANCELLATION OF YOUR SURGERY PATIENT SIGNATURE_________________________________  NURSE SIGNATURE__________________________________  ________________________________________________________________________   Rogelia Mire  An incentive spirometer is a tool that can help keep your lungs clear and active. This tool measures how well you are filling your lungs with each breath. Taking long deep breaths may help reverse or decrease the chance of developing breathing (pulmonary) problems (especially infection) following:  A long period of time when you are unable to move or be active. BEFORE THE PROCEDURE   If the spirometer includes an indicator to show your best effort, your nurse or respiratory therapist will set it to a desired goal.  If possible, sit up straight or lean slightly forward. Try not to slouch.  Hold the incentive spirometer in an upright position. INSTRUCTIONS FOR USE  1. Sit on the edge of your bed if possible, or sit up as far as you can in bed or on a chair. 2. Hold the incentive spirometer in an upright position. 3. Breathe out normally. 4. Place the mouthpiece in your mouth and seal your lips tightly around it. 5. Breathe in slowly and as deeply as possible, raising the piston or the ball toward the top of the column. 6. Hold your breath for 3-5 seconds or for as long as possible. Allow the piston or ball to fall to the bottom of the column. 7. Remove the mouthpiece from your mouth and breathe out normally. 8. Rest for a few seconds and repeat Steps 1 through 7 at least 10 times every 1-2 hours when you are awake. Take your  time and take a few normal breaths between deep breaths. 9. The spirometer may include an indicator to show your best effort. Use  the indicator as a goal to work toward during each repetition. 10. After each set of 10 deep breaths, practice coughing to be sure your lungs are clear. If you have an incision (the cut made at the time of surgery), support your incision when coughing by placing a pillow or rolled up towels firmly against it. Once you are able to get out of bed, walk around indoors and cough well. You may stop using the incentive spirometer when instructed by your caregiver.  RISKS AND COMPLICATIONS  Take your time so you do not get dizzy or light-headed.  If you are in pain, you may need to take or ask for pain medication before doing incentive spirometry. It is harder to take a deep breath if you are having pain. AFTER USE  Rest and breathe slowly and easily.  It can be helpful to keep track of a log of your progress. Your caregiver can provide you with a simple table to help with this. If you are using the spirometer at home, follow these instructions: SEEK MEDICAL CARE IF:   You are having difficultly using the spirometer.  You have trouble using the spirometer as often as instructed.  Your pain medication is not giving enough relief while using the spirometer.  You develop fever of 100.5 F (38.1 C) or higher. SEEK IMMEDIATE MEDICAL CARE IF:   You cough up bloody sputum that had not been present before.  You develop fever of 102 F (38.9 C) or greater.  You develop worsening pain at or near the incision site. MAKE SURE YOU:   Understand these instructions.  Will watch your condition.  Will get help right away if you are not doing well or get worse. Document Released: 01/09/2007 Document Revised: 11/21/2011 Document Reviewed: 03/12/2007 ExitCare Patient Information 2014 ExitCare,  [place].   ________________________________________________________________________  WHAT IS A BLOOD TRANSFUSION? Blood Transfusion Information  A transfusion is the replacement of blood or some of its parts. Blood is made up of multiple cells which provide different functions.  Red blood cells carry oxygen and are used for blood loss replacement.  White blood cells fight against infection.  Platelets control bleeding.  Plasma helps clot blood.  Other blood products are available for specialized needs, such as hemophilia or other clotting disorders. BEFORE THE TRANSFUSION  Who gives blood for transfusions?   Healthy volunteers who are fully evaluated to make sure their blood is safe. This is blood bank blood. Transfusion therapy is the safest it has ever been in the practice of medicine. Before blood is taken from a donor, a complete history is taken to make sure that person has no history of diseases nor engages in risky social behavior (examples are intravenous drug use or sexual activity with multiple partners). The donor's travel history is screened to minimize risk of transmitting infections, such as malaria. The donated blood is tested for signs of infectious diseases, such as HIV and hepatitis. The blood is then tested to be sure it is compatible with you in order to minimize the chance of a transfusion reaction. If you or a relative donates blood, this is often done in anticipation of surgery and is not appropriate for emergency situations. It takes many days to process the donated blood. RISKS AND COMPLICATIONS Although transfusion therapy is very safe and saves many lives, the main dangers of transfusion include:   Getting an infectious disease.  Developing a transfusion reaction. This is an allergic reaction to something in the blood you  were given. Every precaution is taken to prevent this. The decision to have a blood transfusion has been considered carefully by your caregiver  before blood is given. Blood is not given unless the benefits outweigh the risks. AFTER THE TRANSFUSION  Right after receiving a blood transfusion, you will usually feel much better and more energetic. This is especially true if your red blood cells have gotten low (anemic). The transfusion raises the level of the red blood cells which carry oxygen, and this usually causes an energy increase.  The nurse administering the transfusion will monitor you carefully for complications. HOME CARE INSTRUCTIONS  No special instructions are needed after a transfusion. You may find your energy is better. Speak with your caregiver about any limitations on activity for underlying diseases you may have. SEEK MEDICAL CARE IF:   Your condition is not improving after your transfusion.  You develop redness or irritation at the intravenous (IV) site. SEEK IMMEDIATE MEDICAL CARE IF:  Any of the following symptoms occur over the next 12 hours:  Shaking chills.  You have a temperature by mouth above 102 F (38.9 C), not controlled by medicine.  Chest, back, or muscle pain.  People around you feel you are not acting correctly or are confused.  Shortness of breath or difficulty breathing.  Dizziness and fainting.  You get a rash or develop hives.  You have a decrease in urine output.  Your urine turns a dark color or changes to pink, red, or brown. Any of the following symptoms occur over the next 10 days:  You have a temperature by mouth above 102 F (38.9 C), not controlled by medicine.  Shortness of breath.  Weakness after normal activity.  The white part of the eye turns yellow (jaundice).  You have a decrease in the amount of urine or are urinating less often.  Your urine turns a dark color or changes to pink, red, or brown. Document Released: 08/26/2000 Document Revised: 11/21/2011 Document Reviewed: 04/14/2008 [facility] Patient Information 2014 [place],  [place].  _______________________________________________________________________

## 2019-07-19 ENCOUNTER — Encounter (HOSPITAL_COMMUNITY): Payer: Self-pay

## 2019-07-19 ENCOUNTER — Other Ambulatory Visit (HOSPITAL_COMMUNITY)
Admission: RE | Admit: 2019-07-19 | Discharge: 2019-07-19 | Disposition: A | Discharge status: Home / Self Care | Payer: BC Managed Care – PPO | Source: Ambulatory Visit | Attending: Surgery | Admitting: Surgery

## 2019-07-19 ENCOUNTER — Encounter (HOSPITAL_COMMUNITY)
Admission: RE | Admit: 2019-07-19 | Discharge: 2019-07-19 | Disposition: A | Discharge status: Home / Self Care | Payer: BC Managed Care – PPO | Source: Ambulatory Visit | Attending: Surgery | Admitting: Surgery

## 2019-07-19 ENCOUNTER — Other Ambulatory Visit: Payer: Self-pay

## 2019-07-19 DIAGNOSIS — Z20828 Contact with and (suspected) exposure to other viral communicable diseases: Secondary | ICD-10-CM | POA: Insufficient documentation

## 2019-07-19 DIAGNOSIS — Z01812 Encounter for preprocedural laboratory examination: Secondary | ICD-10-CM | POA: Diagnosis not present

## 2019-07-19 HISTORY — DX: Fatty (change of) liver, not elsewhere classified: K76.0

## 2019-07-19 HISTORY — DX: Personal history of urinary calculi: Z87.442

## 2019-07-19 HISTORY — DX: Polyneuropathy, unspecified: G62.9

## 2019-07-19 LAB — COMPREHENSIVE METABOLIC PANEL
ALT: 136 U/L — ABNORMAL HIGH (ref 0–44)
AST: 80 U/L — ABNORMAL HIGH (ref 15–41)
Albumin: 4.3 g/dL (ref 3.5–5.0)
Alkaline Phosphatase: 63 U/L (ref 38–126)
Anion gap: 10 (ref 5–15)
BUN: 12 mg/dL (ref 6–20)
CO2: 23 mmol/L (ref 22–32)
Calcium: 9.1 mg/dL (ref 8.9–10.3)
Chloride: 102 mmol/L (ref 98–111)
Creatinine, Ser: 0.71 mg/dL (ref 0.61–1.24)
GFR calc Af Amer: >60 mL/min (ref >60)
GFR calc non Af Amer: >60 mL/min (ref >60)
Glucose, Bld: 303 mg/dL — ABNORMAL HIGH (ref 70–99)
Potassium: 3.9 mmol/L (ref 3.5–5.1)
Sodium: 135 mmol/L (ref 135–145)
Total Bilirubin: 1.1 mg/dL (ref 0.3–1.2)
Total Protein: 7.4 g/dL (ref 6.5–8.1)

## 2019-07-19 LAB — CBC WITH DIFFERENTIAL/PLATELET
Abs Immature Granulocytes: 0.06 10*3/uL (ref 0.00–0.07)
Basophils Absolute: 0.1 10*3/uL (ref 0.0–0.1)
Basophils Relative: 1 %
Eosinophils Absolute: 0.4 10*3/uL (ref 0.0–0.5)
Eosinophils Relative: 6 %
HCT: 45.2 % (ref 39.0–52.0)
Hemoglobin: 15.2 g/dL (ref 13.0–17.0)
Immature Granulocytes: 1 %
Lymphocytes Relative: 31 %
Lymphs Abs: 2.1 10*3/uL (ref 0.7–4.0)
MCH: 28.5 pg (ref 26.0–34.0)
MCHC: 33.6 g/dL (ref 30.0–36.0)
MCV: 84.8 fL (ref 80.0–100.0)
Monocytes Absolute: 0.4 10*3/uL (ref 0.1–1.0)
Monocytes Relative: 6 %
Neutro Abs: 3.8 10*3/uL (ref 1.7–7.7)
Neutrophils Relative %: 55 %
Platelets: 222 10*3/uL (ref 150–400)
RBC: 5.33 MIL/uL (ref 4.22–5.81)
RDW: 12.7 % (ref 11.5–15.5)
WBC: 6.8 10*3/uL (ref 4.0–10.5)
nRBC: 0 % (ref 0.0–0.2)

## 2019-07-19 LAB — ABO/RH: ABO/RH(D): A POS

## 2019-07-19 LAB — HEMOGLOBIN A1C
Hgb A1c MFr Bld: 8.6 % — ABNORMAL HIGH (ref 4.8–5.6)
Mean Plasma Glucose: 200.12 mg/dL

## 2019-07-19 NOTE — Progress Notes (Signed)
SPOKE W/  Vonna Kotyk     SCREENING SYMPTOMS OF COVID 19:   COUGH--NO  RUNNY NOSE--- NO  SORE THROAT---NO  NASAL CONGESTION----NO  SNEEZING----NO  SHORTNESS OF BREATH---NO  DIFFICULTY BREATHING---NO  TEMP >100.0 -----NO  UNEXPLAINED BODY ACHES------NO  CHILLS -------- NO  HEADACHES ---------NO  LOSS OF SMELL/ TASTE --------NO    HAVE YOU OR ANY FAMILY MEMBER TRAVELLED PAST 14 DAYS OUT OF THE   COUNTY---Lives in [facility] STATE----NO COUNTRY----NO  HAVE YOU OR ANY FAMILY MEMBER BEEN EXPOSED TO ANYONE WITH COVID 19? NO

## 2019-07-19 NOTE — Progress Notes (Addendum)
PCP - Aggie Hacker P.A. Cardiologist - N/A  Chest x-ray - 10/19/2018 in epic EKG - 10/19/2018 in epic Stress Test - N/A ECHO - N/A Cardiac Cath - N/A  Sleep Study - Yes CPAP - does not use CPAP  Fasting Blood Sugar - 175-275 Checks Blood Sugar-once daily and prn  Blood Thinner Instructions: N/A Aspirin Instructions: Yes Last Dose: Last dose Oct. 19, 2020  Anesthesia review: Hgb A1C 8.6, Glucose 303 07/19/2019  Patient denies shortness of breath, fever, cough and chest pain at PAT appointment   Patient verbalized understanding of instructions that were given to them at the PAT appointment. Patient was also instructed that they will need to review over the PAT instructions again at home before surgery.

## 2019-07-20 LAB — NOVEL CORONAVIRUS, NAA (HOSP ORDER, SEND-OUT TO REF LAB; TAT 18-24 HRS): SARS-CoV-2, NAA: NOT DETECTED

## 2019-07-22 MED ORDER — BUPIVACAINE LIPOSOME 1.3 % IJ SUSP
20.0000 mL | Freq: Once | INTRAMUSCULAR | Status: DC
  Filled 2019-07-22: qty 20

## 2019-07-22 NOTE — Progress Notes (Signed)
Anesthesia Chart Review   Case: [id number] Date/Time: 07/23/19 0932   Procedure: LAPAROSCOPIC GASTRIC SLEEVE RESECTION, Upper Endo, ERAS Pathway (N/A )   Anesthesia type: General   Pre-op diagnosis: Morbid Obesity, DM II, GERD, Anxiety   Location: [facility] ROOM 01 / [facility]   Surgeon: [name], MD      DISCUSSION:43 y.o. never smoker with h/o DM II, GERD, sleep apnea (recently diagnosed, device prescribed, per nurse interview he is not using), morbid obesity scheduled for above procedure 07/23/2019 with Dr. Romana Juniper 07/23/2019.    Elevated blood glucose of 303 at PAT visit, A1C 8.6.  Reports fasting glucose between 175-275.  A1C in July of this year 11.4, so DM control improving.    AST 80 ALT 136 at PAT visit.  Labs from 03/25/2019 reviewed, at this time AST 59 ALT 110; stable.   Discussed with Dr. Therisa Doyne.  Anticipate pt can proceed with planned procedure barring acute status change.   VS: BP (!) 149/95   Pulse 90   Temp 36.7 C (Oral)   Resp 18   Ht 5\' 10"  (1.778 m)   Wt (!) 140.4 kg   SpO2 98%   BMI 44.41 kg/m   PROVIDERS: Lavella Lemons, PA is PCP    LABS: Labs reviewed: Acceptable for surgery. (all labs ordered are listed, but only abnormal results are displayed)  Labs Reviewed  COMPREHENSIVE METABOLIC PANEL - Abnormal; Notable for the following components:      Result Value   Glucose, Bld 303 (*)    AST 80 (*)    ALT 136 (*)    All other components within normal limits  HEMOGLOBIN A1C - Abnormal; Notable for the following components:   Hgb A1c MFr Bld 8.6 (*)    All other components within normal limits  CBC WITH DIFFERENTIAL/PLATELET  TYPE AND SCREEN  ABO/RH     IMAGES: Chest Xray 10/19/2018 FINDINGS: Lungs are clear.  No pleural effusion or pneumothorax. The heart is normal in size. Visualized osseous structures are within normal limits. IMPRESSION: Normal chest radiographs.  EKG: 10/19/2018 Rate 93 bpm Normal sinus rhythm  Normal  ECG  CV:  Past Medical History:  Diagnosis Date  . Anxiety   . Fatty liver   . GERD (gastroesophageal reflux disease)   . Hemorrhoids   . History of kidney stones   . Obesity   . Peripheral neuropathy   . Sleep apnea    does not have a CPAP machine  . Type 2 diabetes mellitus ([place])     Past Surgical History:  Procedure Laterality Date  . ANAL FISSURE REPAIR    . APPENDECTOMY    . HERNIA REPAIR      MEDICATIONS: . aspirin EC 81 MG tablet  . cetirizine (ZYRTEC) 10 MG tablet  . ibuprofen (ADVIL) 200 MG tablet  . metFORMIN (GLUCOPHAGE-XR) 500 MG 24 hr tablet  . omeprazole (PRILOSEC) 10 MG capsule  . PARoxetine (PAXIL) 40 MG tablet   No current facility-administered medications for this encounter.    Derrill Memo ON 07/23/2019] bupivacaine liposome (EXPAREL) 1.3 % injection 266 mg   Maia Plan [facility] Pre-Surgical Testing [phone] 07/22/19  2:20 PM

## 2019-07-22 NOTE — Anesthesia Preprocedure Evaluation (Addendum)
Anesthesia Evaluation  Patient identified by MRN, date of birth, ID band Patient awake    Reviewed: Allergy & Precautions, NPO status , Patient's Chart, lab work & pertinent test results  Airway Mallampati: I  TM Distance: >3 FB Neck ROM: Full    Dental  (+) Teeth Intact, Dental Advisory Given   Pulmonary    breath sounds clear to auscultation       Cardiovascular  Rhythm:Regular Rate:Normal     Neuro/Psych    GI/Hepatic   Endo/Other  diabetes  Renal/GU      Musculoskeletal   Abdominal Normal abdominal exam  (+)   Peds  Hematology   Anesthesia Other Findings   Reproductive/Obstetrics                            Anesthesia Physical Anesthesia Plan  ASA: III  Anesthesia Plan: General   Post-op Pain Management:    Induction: Intravenous  PONV Risk Score and Plan: 3 and Ondansetron, Dexamethasone, Midazolam and Treatment may vary due to age or medical condition  Airway Management Planned: Oral ETT  Additional Equipment: None  Intra-op Plan:   Post-operative Plan: Extubation in OR  Informed Consent: I have reviewed the patients History and Physical, chart, labs and discussed the procedure including the risks, benefits and alternatives for the proposed anesthesia with the patient or authorized representative who has indicated his/her understanding and acceptance.     Dental advisory given  Plan Discussed with: CRNA  Anesthesia Plan Comments: (See PAT note 07/19/2019, Konrad Felix, PA-C  BG 348 in Preop, 10U IV Insulin administered. )       Anesthesia Quick Evaluation

## 2019-07-22 NOTE — Progress Notes (Signed)
Malik Ramos made aware about surgery time change, new arrival time 7:45 AM, Drink at 6:45 AM. He verbalized understanding.

## 2019-07-23 ENCOUNTER — Encounter (HOSPITAL_COMMUNITY): Payer: Self-pay | Admitting: *Deleted

## 2019-07-23 ENCOUNTER — Encounter (HOSPITAL_COMMUNITY)
Admission: RE | Disposition: A | Discharge status: Home / Self Care | Payer: Self-pay | Source: Home / Self Care | Attending: Surgery

## 2019-07-23 ENCOUNTER — Other Ambulatory Visit: Payer: Self-pay

## 2019-07-23 ENCOUNTER — Ambulatory Visit (HOSPITAL_COMMUNITY): Payer: BC Managed Care – PPO | Admitting: Physician Assistant

## 2019-07-23 ENCOUNTER — Ambulatory Visit (HOSPITAL_COMMUNITY): Payer: BC Managed Care – PPO | Admitting: Anesthesiology

## 2019-07-23 ENCOUNTER — Ambulatory Visit (HOSPITAL_COMMUNITY)
Admission: RE | Admit: 2019-07-23 | Discharge: 2019-07-24 | Disposition: A | Discharge status: Home / Self Care | Payer: BC Managed Care – PPO | Attending: Surgery | Admitting: Surgery

## 2019-07-23 DIAGNOSIS — G4733 Obstructive sleep apnea (adult) (pediatric): Secondary | ICD-10-CM | POA: Insufficient documentation

## 2019-07-23 DIAGNOSIS — E119 Type 2 diabetes mellitus without complications: Secondary | ICD-10-CM | POA: Diagnosis not present

## 2019-07-23 DIAGNOSIS — M2550 Pain in unspecified joint: Secondary | ICD-10-CM | POA: Insufficient documentation

## 2019-07-23 DIAGNOSIS — Z794 Long term (current) use of insulin: Secondary | ICD-10-CM | POA: Diagnosis not present

## 2019-07-23 DIAGNOSIS — K219 Gastro-esophageal reflux disease without esophagitis: Secondary | ICD-10-CM | POA: Diagnosis not present

## 2019-07-23 DIAGNOSIS — F419 Anxiety disorder, unspecified: Secondary | ICD-10-CM | POA: Diagnosis not present

## 2019-07-23 DIAGNOSIS — K760 Fatty (change of) liver, not elsewhere classified: Secondary | ICD-10-CM | POA: Diagnosis not present

## 2019-07-23 DIAGNOSIS — Z791 Long term (current) use of non-steroidal anti-inflammatories (NSAID): Secondary | ICD-10-CM | POA: Insufficient documentation

## 2019-07-23 DIAGNOSIS — Z7982 Long term (current) use of aspirin: Secondary | ICD-10-CM | POA: Insufficient documentation

## 2019-07-23 DIAGNOSIS — Z6841 Body Mass Index (BMI) 40.0 and over, adult: Secondary | ICD-10-CM | POA: Diagnosis not present

## 2019-07-23 DIAGNOSIS — E1165 Type 2 diabetes mellitus with hyperglycemia: Secondary | ICD-10-CM | POA: Insufficient documentation

## 2019-07-23 DIAGNOSIS — Z8249 Family history of ischemic heart disease and other diseases of the circulatory system: Secondary | ICD-10-CM | POA: Diagnosis not present

## 2019-07-23 DIAGNOSIS — I10 Essential (primary) hypertension: Secondary | ICD-10-CM | POA: Insufficient documentation

## 2019-07-23 DIAGNOSIS — Z79899 Other long term (current) drug therapy: Secondary | ICD-10-CM | POA: Diagnosis not present

## 2019-07-23 DIAGNOSIS — Z885 Allergy status to narcotic agent status: Secondary | ICD-10-CM | POA: Insufficient documentation

## 2019-07-23 HISTORY — PX: LAPAROSCOPIC GASTRIC SLEEVE RESECTION: SHX5895

## 2019-07-23 LAB — TYPE AND SCREEN
ABO/RH(D): A POS
Antibody Screen: NEGATIVE

## 2019-07-23 LAB — GLUCOSE, CAPILLARY
Glucose-Capillary: 220 mg/dL — ABNORMAL HIGH (ref 70–99)
Glucose-Capillary: 245 mg/dL — ABNORMAL HIGH (ref 70–99)
Glucose-Capillary: 303 mg/dL — ABNORMAL HIGH (ref 70–99)
Glucose-Capillary: 310 mg/dL — ABNORMAL HIGH (ref 70–99)
Glucose-Capillary: 330 mg/dL — ABNORMAL HIGH (ref 70–99)
Glucose-Capillary: 342 mg/dL — ABNORMAL HIGH (ref 70–99)
Glucose-Capillary: 346 mg/dL — ABNORMAL HIGH (ref 70–99)
Glucose-Capillary: 348 mg/dL — ABNORMAL HIGH (ref 70–99)
Glucose-Capillary: 356 mg/dL — ABNORMAL HIGH (ref 70–99)
Glucose-Capillary: 376 mg/dL — ABNORMAL HIGH (ref 70–99)

## 2019-07-23 SURGERY — GASTRECTOMY, SLEEVE, LAPAROSCOPIC
Anesthesia: General | Site: Abdomen

## 2019-07-23 MED ORDER — FENTANYL CITRATE (PF) 250 MCG/5ML IJ SOLN
INTRAMUSCULAR | Status: DC | PRN
  Administered 2019-07-23: 50 ug via INTRAVENOUS
  Administered 2019-07-23: 100 ug via INTRAVENOUS

## 2019-07-23 MED ORDER — MIDAZOLAM HCL 5 MG/5ML IJ SOLN
INTRAMUSCULAR | Status: DC | PRN
  Administered 2019-07-23: 2 mg via INTRAVENOUS

## 2019-07-23 MED ORDER — ACETAMINOPHEN 500 MG PO TABS
1000.0000 mg | ORAL_TABLET | Freq: Three times a day (TID) | ORAL | Status: DC
  Administered 2019-07-23 – 2019-07-24 (×2): 1000 mg via ORAL
  Filled 2019-07-23 (×2): qty 2

## 2019-07-23 MED ORDER — BUPIVACAINE-EPINEPHRINE 0.25% -1:200000 IJ SOLN
INTRAMUSCULAR | Status: AC
  Filled 2019-07-23: qty 1

## 2019-07-23 MED ORDER — PANTOPRAZOLE SODIUM 40 MG IV SOLR
40.0000 mg | Freq: Every day | INTRAVENOUS | Status: DC
  Administered 2019-07-23: 40 mg via INTRAVENOUS
  Filled 2019-07-23: qty 40

## 2019-07-23 MED ORDER — GABAPENTIN 300 MG PO CAPS
300.0000 mg | ORAL_CAPSULE | ORAL | Status: AC
  Administered 2019-07-23: 300 mg via ORAL
  Filled 2019-07-23: qty 1

## 2019-07-23 MED ORDER — PAROXETINE HCL 20 MG PO TABS
40.0000 mg | ORAL_TABLET | Freq: Every day | ORAL | Status: DC
  Administered 2019-07-23: 40 mg via ORAL
  Filled 2019-07-23 (×2): qty 2

## 2019-07-23 MED ORDER — ACETAMINOPHEN 160 MG/5ML PO SOLN: 325.0000 mg | Freq: Once | ORAL | Status: DC | PRN

## 2019-07-23 MED ORDER — LACTATED RINGERS IV SOLN: INTRAVENOUS | Status: DC

## 2019-07-23 MED ORDER — MIDAZOLAM HCL 2 MG/2ML IJ SOLN
INTRAMUSCULAR | Status: AC
  Filled 2019-07-23: qty 2

## 2019-07-23 MED ORDER — SODIUM CHLORIDE 0.9 % IV SOLN
INTRAVENOUS | Status: DC
  Administered 2019-07-23 (×2): via INTRAVENOUS

## 2019-07-23 MED ORDER — DEXAMETHASONE SODIUM PHOSPHATE 10 MG/ML IJ SOLN
INTRAMUSCULAR | Status: AC
  Filled 2019-07-23: qty 1

## 2019-07-23 MED ORDER — SIMETHICONE 80 MG PO CHEW: 80.0000 mg | CHEWABLE_TABLET | Freq: Four times a day (QID) | ORAL | Status: DC | PRN

## 2019-07-23 MED ORDER — ACETAMINOPHEN 325 MG PO TABS: 325.0000 mg | ORAL_TABLET | Freq: Once | ORAL | Status: DC | PRN

## 2019-07-23 MED ORDER — SUGAMMADEX SODIUM 200 MG/2ML IV SOLN
INTRAVENOUS | Status: DC | PRN
  Administered 2019-07-23: 300 mg via INTRAVENOUS

## 2019-07-23 MED ORDER — SUCCINYLCHOLINE CHLORIDE 20 MG/ML IJ SOLN
INTRAMUSCULAR | Status: DC | PRN
  Administered 2019-07-23: 120 mg via INTRAVENOUS

## 2019-07-23 MED ORDER — INSULIN ASPART 100 UNIT/ML ~~LOC~~ SOLN
SUBCUTANEOUS | Status: AC
  Filled 2019-07-23: qty 1

## 2019-07-23 MED ORDER — ROCURONIUM BROMIDE 10 MG/ML (PF) SYRINGE
PREFILLED_SYRINGE | INTRAVENOUS | Status: AC
  Filled 2019-07-23: qty 10

## 2019-07-23 MED ORDER — ENOXAPARIN SODIUM 40 MG/0.4ML ~~LOC~~ SOLN
40.0000 mg | SUBCUTANEOUS | Status: AC
  Administered 2019-07-23: 40 mg via SUBCUTANEOUS
  Filled 2019-07-23: qty 0.4

## 2019-07-23 MED ORDER — FENTANYL CITRATE (PF) 250 MCG/5ML IJ SOLN
INTRAMUSCULAR | Status: AC
  Filled 2019-07-23: qty 5

## 2019-07-23 MED ORDER — KETAMINE HCL 10 MG/ML IJ SOLN
INTRAMUSCULAR | Status: AC
  Filled 2019-07-23: qty 1

## 2019-07-23 MED ORDER — SODIUM CHLORIDE 0.9 % IV SOLN
2.0000 g | INTRAVENOUS | Status: AC
  Administered 2019-07-23: 2 g via INTRAVENOUS
  Filled 2019-07-23: qty 2

## 2019-07-23 MED ORDER — DEXAMETHASONE SODIUM PHOSPHATE 10 MG/ML IJ SOLN
INTRAMUSCULAR | Status: DC | PRN
  Administered 2019-07-23: 4 mg via INTRAVENOUS

## 2019-07-23 MED ORDER — METOCLOPRAMIDE HCL 5 MG/ML IJ SOLN: 10.0000 mg | Freq: Four times a day (QID) | INTRAMUSCULAR | Status: DC | PRN

## 2019-07-23 MED ORDER — METOPROLOL TARTRATE 5 MG/5ML IV SOLN: 5.0000 mg | Freq: Four times a day (QID) | INTRAVENOUS | Status: DC | PRN

## 2019-07-23 MED ORDER — METHOCARBAMOL 1000 MG/10ML IJ SOLN
500.0000 mg | Freq: Four times a day (QID) | INTRAVENOUS | Status: DC | PRN
  Filled 2019-07-23: qty 5

## 2019-07-23 MED ORDER — ONDANSETRON HCL 4 MG/2ML IJ SOLN
4.0000 mg | INTRAMUSCULAR | Status: DC | PRN
  Administered 2019-07-23: 4 mg via INTRAVENOUS
  Filled 2019-07-23: qty 2

## 2019-07-23 MED ORDER — HYDRALAZINE HCL 20 MG/ML IJ SOLN: 10.0000 mg | INTRAMUSCULAR | Status: DC | PRN

## 2019-07-23 MED ORDER — APREPITANT 40 MG PO CAPS
40.0000 mg | ORAL_CAPSULE | ORAL | Status: AC
  Administered 2019-07-23: 40 mg via ORAL
  Filled 2019-07-23: qty 1

## 2019-07-23 MED ORDER — 0.9 % SODIUM CHLORIDE (POUR BTL) OPTIME
TOPICAL | Status: DC | PRN
  Administered 2019-07-23: 1000 mL

## 2019-07-23 MED ORDER — LACTATED RINGERS IV SOLN
INTRAVENOUS | Status: DC
  Administered 2019-07-23: 08:00:00 via INTRAVENOUS

## 2019-07-23 MED ORDER — PROPOFOL 10 MG/ML IV BOLUS
INTRAVENOUS | Status: DC | PRN
  Administered 2019-07-23: 200 mg via INTRAVENOUS

## 2019-07-23 MED ORDER — TRAMADOL HCL 50 MG PO TABS: 50.0000 mg | ORAL_TABLET | Freq: Four times a day (QID) | ORAL | Status: DC | PRN

## 2019-07-23 MED ORDER — PROMETHAZINE HCL 25 MG/ML IJ SOLN: 6.2500 mg | INTRAMUSCULAR | Status: DC | PRN

## 2019-07-23 MED ORDER — SCOPOLAMINE 1 MG/3DAYS TD PT72
1.0000 | MEDICATED_PATCH | TRANSDERMAL | Status: DC
  Administered 2019-07-23: 1.5 mg via TRANSDERMAL
  Filled 2019-07-23: qty 1

## 2019-07-23 MED ORDER — INSULIN ASPART 100 UNIT/ML ~~LOC~~ SOLN
0.0000 [IU] | SUBCUTANEOUS | Status: DC
  Administered 2019-07-23: 7 [IU] via SUBCUTANEOUS
  Administered 2019-07-23: 15 [IU] via SUBCUTANEOUS
  Administered 2019-07-23: 20 [IU] via SUBCUTANEOUS
  Administered 2019-07-23: 15 [IU] via SUBCUTANEOUS
  Administered 2019-07-24 (×2): 7 [IU] via SUBCUTANEOUS
  Administered 2019-07-24: 4 [IU] via SUBCUTANEOUS

## 2019-07-23 MED ORDER — INSULIN ASPART 100 UNIT/ML ~~LOC~~ SOLN
10.0000 [IU] | Freq: Once | SUBCUTANEOUS | Status: AC
  Administered 2019-07-23: 10 [IU] via INTRAVENOUS

## 2019-07-23 MED ORDER — ONDANSETRON HCL 4 MG/2ML IJ SOLN
INTRAMUSCULAR | Status: DC | PRN
  Administered 2019-07-23: 4 mg via INTRAVENOUS

## 2019-07-23 MED ORDER — ENOXAPARIN SODIUM 30 MG/0.3ML ~~LOC~~ SOLN
30.0000 mg | Freq: Two times a day (BID) | SUBCUTANEOUS | Status: DC
  Administered 2019-07-23 – 2019-07-24 (×2): 30 mg via SUBCUTANEOUS
  Filled 2019-07-23 (×2): qty 0.3

## 2019-07-23 MED ORDER — HYDROMORPHONE HCL 1 MG/ML IJ SOLN: 0.5000 mg | INTRAMUSCULAR | Status: DC | PRN

## 2019-07-23 MED ORDER — ONDANSETRON HCL 4 MG/2ML IJ SOLN
INTRAMUSCULAR | Status: AC
  Filled 2019-07-23: qty 2

## 2019-07-23 MED ORDER — LIDOCAINE HCL 2 % IJ SOLN
INTRAMUSCULAR | Status: AC
  Filled 2019-07-23: qty 20

## 2019-07-23 MED ORDER — BUPIVACAINE LIPOSOME 1.3 % IJ SUSP
INTRAMUSCULAR | Status: DC | PRN
  Administered 2019-07-23: 20 mL

## 2019-07-23 MED ORDER — CHLORHEXIDINE GLUCONATE 4 % EX LIQD: 60.0000 mL | Freq: Once | CUTANEOUS | Status: DC

## 2019-07-23 MED ORDER — KETOROLAC TROMETHAMINE 15 MG/ML IJ SOLN: 15.0000 mg | Freq: Three times a day (TID) | INTRAMUSCULAR | Status: DC | PRN

## 2019-07-23 MED ORDER — ROCURONIUM BROMIDE 100 MG/10ML IV SOLN
INTRAVENOUS | Status: DC | PRN
  Administered 2019-07-23: 50 mg via INTRAVENOUS
  Administered 2019-07-23 (×2): 10 mg via INTRAVENOUS

## 2019-07-23 MED ORDER — LIDOCAINE 20MG/ML (2%) 15 ML SYRINGE OPTIME
INTRAMUSCULAR | Status: DC | PRN
  Administered 2019-07-23: 1.5 mg/kg/h via INTRAVENOUS

## 2019-07-23 MED ORDER — FENTANYL CITRATE (PF) 100 MCG/2ML IJ SOLN: 25.0000 ug | INTRAMUSCULAR | Status: DC | PRN

## 2019-07-23 MED ORDER — INSULIN ASPART 100 UNIT/ML ~~LOC~~ SOLN
15.0000 [IU] | Freq: Once | SUBCUTANEOUS | Status: AC
  Administered 2019-07-23: 15 [IU] via SUBCUTANEOUS

## 2019-07-23 MED ORDER — GABAPENTIN 100 MG PO CAPS
200.0000 mg | ORAL_CAPSULE | Freq: Two times a day (BID) | ORAL | Status: DC
  Administered 2019-07-23 – 2019-07-24 (×2): 200 mg via ORAL
  Filled 2019-07-23 (×2): qty 2

## 2019-07-23 MED ORDER — LACTATED RINGERS IR SOLN
Status: DC | PRN
  Administered 2019-07-23: 1000 mL

## 2019-07-23 MED ORDER — FENTANYL CITRATE (PF) 100 MCG/2ML IJ SOLN: INTRAMUSCULAR | Status: DC | PRN

## 2019-07-23 MED ORDER — BUPIVACAINE-EPINEPHRINE 0.25% -1:200000 IJ SOLN
INTRAMUSCULAR | Status: DC | PRN
  Administered 2019-07-23: 30 mL

## 2019-07-23 MED ORDER — MEPERIDINE HCL 50 MG/ML IJ SOLN: 6.2500 mg | INTRAMUSCULAR | Status: DC | PRN

## 2019-07-23 MED ORDER — DOCUSATE SODIUM 100 MG PO CAPS
100.0000 mg | ORAL_CAPSULE | Freq: Two times a day (BID) | ORAL | Status: DC
  Administered 2019-07-23 – 2019-07-24 (×2): 100 mg via ORAL
  Filled 2019-07-23 (×2): qty 1

## 2019-07-23 MED ORDER — PROPOFOL 10 MG/ML IV BOLUS
INTRAVENOUS | Status: AC
  Filled 2019-07-23: qty 20

## 2019-07-23 MED ORDER — ACETAMINOPHEN 10 MG/ML IV SOLN: 1000.0000 mg | Freq: Once | INTRAVENOUS | Status: DC | PRN

## 2019-07-23 MED ORDER — ACETAMINOPHEN 160 MG/5ML PO SOLN
1000.0000 mg | Freq: Three times a day (TID) | ORAL | Status: DC
  Filled 2019-07-23: qty 40.6

## 2019-07-23 MED ORDER — ENSURE MAX PROTEIN PO LIQD
2.0000 [oz_av] | ORAL | Status: DC
  Administered 2019-07-24 (×2): 2 [oz_av] via ORAL

## 2019-07-23 MED ORDER — ACETAMINOPHEN 500 MG PO TABS
1000.0000 mg | ORAL_TABLET | ORAL | Status: AC
  Administered 2019-07-23: 1000 mg via ORAL
  Filled 2019-07-23: qty 2

## 2019-07-23 MED ORDER — KETAMINE HCL 10 MG/ML IJ SOLN
INTRAMUSCULAR | Status: DC | PRN
  Administered 2019-07-23: 40 mg via INTRAVENOUS

## 2019-07-23 MED ORDER — OXYCODONE HCL 5 MG/5ML PO SOLN
5.0000 mg | Freq: Four times a day (QID) | ORAL | Status: DC | PRN
  Administered 2019-07-23: 5 mg via ORAL
  Filled 2019-07-23: qty 5

## 2019-07-23 SURGICAL SUPPLY — 70 items
APPLIER CLIP ROT 10 11.4 M/L (STAPLE)
APPLIER CLIP ROT 13.4 12 LRG (CLIP) ×3
BAG LAPAROSCOPIC 12 15 PORT 16 (BASKET) ×1 IMPLANT
BAG RETRIEVAL 12/15 (BASKET) ×2
BAG RETRIEVAL 12/15MM (BASKET) ×1
BENZOIN TINCTURE PRP APPL 2/3 (GAUZE/BANDAGES/DRESSINGS) ×3 IMPLANT
BLADE SURG SZ11 CARB STEEL (BLADE) ×3 IMPLANT
BNDG ADH 1X3 SHEER STRL LF (GAUZE/BANDAGES/DRESSINGS) ×18 IMPLANT
CABLE HIGH FREQUENCY MONO STRZ (ELECTRODE) ×3 IMPLANT
CHLORAPREP W/TINT 26 (MISCELLANEOUS) ×6 IMPLANT
CLIP APPLIE ROT 10 11.4 M/L (STAPLE) IMPLANT
CLIP APPLIE ROT 13.4 12 LRG (CLIP) ×1 IMPLANT
CLOSURE WOUND 1/2 X4 (GAUZE/BANDAGES/DRESSINGS) ×1
COVER SURGICAL LIGHT HANDLE (MISCELLANEOUS) ×3 IMPLANT
COVER WAND RF STERILE (DRAPES) IMPLANT
DECANTER SPIKE VIAL GLASS SM (MISCELLANEOUS) ×3 IMPLANT
DEVICE SUT QUICK LOAD TK 5 (STAPLE) IMPLANT
DEVICE SUT TI-KNOT TK 5X26 (MISCELLANEOUS) IMPLANT
DEVICE TI KNOT TK5 (MISCELLANEOUS)
DRAPE UTILITY XL STRL (DRAPES) ×6 IMPLANT
ELECT REM PT RETURN 15FT ADLT (MISCELLANEOUS) ×3 IMPLANT
GAUZE SPONGE 4X4 12PLY STRL (GAUZE/BANDAGES/DRESSINGS) IMPLANT
GLOVE BIO SURGEON STRL SZ 6 (GLOVE) ×3 IMPLANT
GLOVE BIOGEL PI IND STRL 7.0 (GLOVE) ×4 IMPLANT
GLOVE BIOGEL PI INDICATOR 7.0 (GLOVE) ×8
GLOVE INDICATOR 6.5 STRL GRN (GLOVE) ×3 IMPLANT
GLOVE SURG SS PI 7.0 STRL IVOR (GLOVE) ×6 IMPLANT
GOWN STRL REUS W/TWL LRG LVL3 (GOWN DISPOSABLE) ×6 IMPLANT
GOWN STRL REUS W/TWL XL LVL3 (GOWN DISPOSABLE) ×6 IMPLANT
GRASPER SUT TROCAR 14GX15 (MISCELLANEOUS) ×3 IMPLANT
HOVERMATT SINGLE USE (MISCELLANEOUS) ×3 IMPLANT
KIT BASIN OR (CUSTOM PROCEDURE TRAY) ×3 IMPLANT
KIT TURNOVER KIT A (KITS) IMPLANT
MARKER SKIN DUAL TIP RULER LAB (MISCELLANEOUS) ×3 IMPLANT
NEEDLE SPNL 22GX3.5 QUINCKE BK (NEEDLE) ×3 IMPLANT
PACK UNIVERSAL I (CUSTOM PROCEDURE TRAY) ×3 IMPLANT
QUICK LOAD TK 5 (STAPLE)
RELOAD ENDO STITCH (ENDOMECHANICALS) IMPLANT
RELOAD STAPLER BLUE 60MM (STAPLE) ×4 IMPLANT
RELOAD STAPLER GOLD 60MM (STAPLE) ×1 IMPLANT
RELOAD STAPLER GREEN 60MM (STAPLE) ×1 IMPLANT
SCISSORS LAP 5X45 EPIX DISP (ENDOMECHANICALS) ×3 IMPLANT
SET IRRIG TUBING LAPAROSCOPIC (IRRIGATION / IRRIGATOR) ×3 IMPLANT
SET TUBE SMOKE EVAC HIGH FLOW (TUBING) ×3 IMPLANT
SHEARS HARMONIC ACE PLUS 45CM (MISCELLANEOUS) ×3 IMPLANT
SLEEVE ADV FIXATION 5X100MM (TROCAR) ×6 IMPLANT
SLEEVE GASTRECTOMY 40FR VISIGI (MISCELLANEOUS) ×3 IMPLANT
SOL ANTI FOG 6CC (MISCELLANEOUS) ×1 IMPLANT
SOLUTION ANTI FOG 6CC (MISCELLANEOUS) ×2
SPONGE LAP 18X18 RF (DISPOSABLE) ×3 IMPLANT
STAPLER ECHELON BIOABSB 60 FLE (MISCELLANEOUS) ×21 IMPLANT
STAPLER ECHELON LONG 60 440 (INSTRUMENTS) ×3 IMPLANT
STAPLER RELOAD BLUE 60MM (STAPLE) ×12
STAPLER RELOAD GOLD 60MM (STAPLE) ×3
STAPLER RELOAD GREEN 60MM (STAPLE) ×3
STRIP CLOSURE SKIN 1/2X4 (GAUZE/BANDAGES/DRESSINGS) ×2 IMPLANT
SUT MNCRL AB 4-0 PS2 18 (SUTURE) ×3 IMPLANT
SUT SURGIDAC NAB ES-9 0 48 120 (SUTURE) IMPLANT
SUT VICRYL 0 TIES 12 18 (SUTURE) ×3 IMPLANT
SYR 10ML ECCENTRIC (SYRINGE) ×3 IMPLANT
SYR 20ML LL LF (SYRINGE) ×3 IMPLANT
SYR 50ML LL SCALE MARK (SYRINGE) ×3 IMPLANT
TOWEL OR 17X26 10 PK STRL BLUE (TOWEL DISPOSABLE) ×3 IMPLANT
TOWEL OR NON WOVEN STRL DISP B (DISPOSABLE) ×3 IMPLANT
TROCAR ADV FIXATION 5X100MM (TROCAR) ×3 IMPLANT
TROCAR BLADELESS 15MM (ENDOMECHANICALS) ×3 IMPLANT
TROCAR BLADELESS OPT 5 100 (ENDOMECHANICALS) ×3 IMPLANT
TUBING CONNECTING 10 (TUBING) ×2 IMPLANT
TUBING CONNECTING 10' (TUBING) ×1
TUBING ENDO SMARTCAP (MISCELLANEOUS) ×3 IMPLANT

## 2019-07-23 NOTE — Anesthesia Procedure Notes (Signed)
Procedure Name: Intubation Date/Time: 07/23/2019 9:52 AM Performed by: Glory Buff, CRNA Pre-anesthesia Checklist: Patient identified, Emergency Drugs available, Suction available and Patient being monitored Patient Re-evaluated:Patient Re-evaluated prior to induction Oxygen Delivery Method: Circle system utilized Preoxygenation: Pre-oxygenation with 100% oxygen Induction Type: IV induction and Rapid sequence Ventilation: Mask ventilation without difficulty Laryngoscope Size: Miller and 3 Grade View: Grade II Tube type: Oral Tube size: 7.5 mm Number of attempts: 1 Airway Equipment and Method: Stylet and Oral airway Placement Confirmation: ETT inserted through vocal cords under direct vision,  positive ETCO2 and breath sounds checked- equal and bilateral Secured at: 22 cm Tube secured with: Tape Dental Injury: Teeth and Oropharynx as per pre-operative assessment

## 2019-07-23 NOTE — Transfer of Care (Signed)
Immediate Anesthesia Transfer of Care Note  Patient: Malik Ramos  Procedure(s) Performed: LAPAROSCOPIC GASTRIC SLEEVE RESECTION, Upper Endo, ERAS Pathway (N/A Abdomen)  Patient Location: PACU  Anesthesia Type:General  Level of Consciousness: drowsy, patient cooperative and responds to stimulation  Airway & Oxygen Therapy: Patient Spontanous Breathing and Patient connected to face mask oxygen  Post-op Assessment: Report given to RN and Post -op Vital signs reviewed and stable  Post vital signs: Reviewed and stable  Last Vitals:  Vitals Value Taken Time  BP 176/82 07/23/19 1145  Temp    Pulse 79 07/23/19 1147  Resp 14 07/23/19 1147  SpO2 99 % 07/23/19 1147  Vitals shown include unvalidated device data.  Last Pain:  Vitals:   07/23/19 0801  TempSrc: Oral         Complications: No apparent anesthesia complications

## 2019-07-23 NOTE — Progress Notes (Signed)
Inpatient Diabetes Program Recommendations  AACE/ADA: New Consensus Statement on Inpatient Glycemic Control (2015)  Target Ranges:  Prepandial:   less than 140 mg/dL      Peak postprandial:   less than 180 mg/dL (1-2 hours)      Critically ill patients:  140 - 180 mg/dL   Lab Results  Component Value Date   GLUCAP 356 (H) 07/23/2019   HGBA1C 8.6 (H) 07/19/2019    Review of Glycemic Control  Diabetes history: DM2 Outpatient Diabetes medications: metformin 2000 mg QHS Current orders for Inpatient glycemic control: Novolog 0-20 units Q4H  Hgba1C - 8.6% - not at goal of < 7% Blood sugars continue in 300s.  Inpatient Diabetes Program Recommendations:     Add Lantus 12 units QHS  Continue to follow closely.  Thank you. Lorenda Peck, RD, LDN, CDE Inpatient Diabetes Coordinator [phone]

## 2019-07-23 NOTE — Op Note (Signed)
Operative Note  TREVONTE ASHKAR  [id number]  [id number]  07/23/2019   Surgeon: Victorino Sparrow ConnorMD FACS  Assistant: Gurney Maxin MD FACS  Procedure performed: laparoscopic sleeve gastrectomy, upper endoscopy  Preop diagnosis: Morbid obesity BMI 44.4. Diabetes, Obstructive sleep apnea, reflux, joint pain Post-op diagnosis/intraop findings: same, enlarged liver with hepatosteatosis  Specimens: fundus Retained items: none EBL: 30 cc Complications: none  Description of procedure: After obtaining informed consent and administration of prophylactic lovenox in holding, the patient was taken to the operating room and placed supine on operating room table wheregeneral endotracheal anesthesia was initiated, preoperative antibiotics were administered, SCDs applied, and a formal timeout was performed. The abdomen was prepped and draped in usual sterile fashion. Peritoneal access was gained using a Visiport technique in the left upper quadrant and insufflation to 15 mmHg ensued without issue. Gross inspection revealed no evidence of injury. There are omental adhesions at and just above the level of the umbilicus. Under direct visualization three more 5 mm trochars were placed in the right and left hemiabdomen and the 28mm trocar in the right paramedian upper abdomen. Bilateral laparoscopic assisted TAPS blocks were performed with Exparel diluted with 0.25 percent Marcaine. The patient was placed in steep Trendelenburg and the liver retractor was introduced through an incision in the upper midline and secured to the post externally to maintain the left lobe retracted anteriorly.  There was no hiatal hernia. Using the Harmonic scalpel, the greater curvature of the stomach was dissected away from the greater omentum and short gastric vessels were divided. This began 6 cm from the pylorus, and dissection proceeded until the left crus was identified. There were some filmy adhesions of the posterior  stomach to the pancreas which were taken down the Harmonic.  The 57 [place] VisiGi was then introduced and directed down towards the pylorus. This was placed to suction against the lesser curve. Serial fires of the linear cutting stapler with seamguards were then employed to create our sleeve. The first fire used a green load and ensured adequate room at the angularis incisura. One gold load and then several blue loads were then employed to create an evenly tubular stomach up to the angle of His. The excised stomach was then removed through our 15 mm trocar site within an Endo Catch bag.   The visigi was taken off of suction and a few puffs of air were introduced, inflating the sleeve. No bubbles were observed in the irrigation fluid around the stomach and the shape was noted to be a nice smooth tube without any narrowing at the angularis. The visigi was then removed. Upper endoscopy was performed by the assistant surgeon and the sleeve was noted to be airtight, the staple line was hemostatic. Please see his separate note. The endoscope was removed. A small amount of oozing on the distal staple line was addressed with clips. The 15 mm trocar site fascia in the right upper abdomen was closed with a 0 Vicryl using the laparoscopic suture passer under direct visualization. The liver retractor was removed under direct visualization. The abdomen was then desufflated and all remaining trochars removed. The skin incisions were closed with subcuticular Monocryl; benzoin, Steri-Strips and Band-Aids were applied The patient was then awakened, extubated and taken to PACU in stable condition.    All counts were correct at the completion of the case.

## 2019-07-23 NOTE — Discharge Instructions (Signed)
° ° ° °GASTRIC BYPASS/SLEEVE ° Home Care Instructions ° ° These instructions are to help you care for yourself when you go home. ° °Call: If you have any problems. °• Call [phone] and ask for the surgeon on call °• If you need immediate help, come to the ER at [facility].  °• Tell the ER staff that you are a new post-op gastric bypass or gastric sleeve patient °  °Signs and symptoms to report: • Severe vomiting or nausea °o If you cannot keep down clear liquids for longer than 1 day, call your surgeon  °• Abdominal pain that does not get better after taking your pain medication °• Fever over 100.4° F with chills °• Heart beating over 100 beats a minute °• Shortness of breath at rest °• Chest pain °•  Redness, swelling, drainage, or foul odor at incision (surgical) sites °•  If your incisions open or pull apart °• Swelling or pain in calf (lower leg) °• Diarrhea (Loose bowel movements that happen often), frequent watery, uncontrolled bowel movements °• Constipation, (no bowel movements for 3 days) if this happens: Pick one °o Milk of Magnesia, 2 tablespoons by mouth, 3 times a day for 2 days if needed °o Stop taking Milk of Magnesia once you have a bowel movement °o Call your doctor if constipation continues °Or °o Miralax  (instead of Milk of Magnesia) following the label instructions °o Stop taking Miralax once you have a bowel movement °o Call your doctor if constipation continues °• Anything you think is not normal °  °Normal side effects after surgery: • Unable to sleep at night or unable to focus °• Irritability or moody °• Being tearful (crying) or depressed °These are common complaints, possibly related to your anesthesia medications that put you to sleep, stress of surgery, and change in lifestyle.  This usually goes away a few weeks after surgery.  If these feelings continue, call your primary care doctor. °  °Wound Care: You may have surgical glue, steri-strips, or staples over your incisions after  surgery °• Surgical glue:  Looks like a clear film over your incisions and will wear off a little at a time °• Steri-strips: Strips of tape over your incisions. You may notice a yellowish color on the skin under the steri-strips. This is used to make the   steri-strips stick better. Do not pull the steri-strips off - let them fall off °• Staples: Staples may be removed before you leave the hospital °o If you go home with staples, call [facility], [phone] at for an appointment with your surgeon’s nurse to have staples removed 10 days after surgery. °• Showering: You may shower two (2) days after your surgery unless your surgeon tells you differently °o Wash gently around incisions with warm soapy water, rinse well, and gently pat dry  °o No tub baths until staples are removed, steri-strips fall off or glue is gone.  °  °Medications: • Medications should be liquid or crushed if larger than the size of a dime °• Extended release pills (medication that release a little bit at a time through the day) should NOT be crushed or cut. (examples include XL, ER, DR, SR) °• Depending on the size and number of medications you take, you may need to space (take a few throughout the day)/change the time you take your medications so that you do not over-fill your pouch (smaller stomach) °• Make sure you follow-up with your primary care doctor to   make medication changes needed during rapid weight loss and life-style changes °• If you have diabetes, follow up with the doctor that orders your diabetes medication(s) within one week after surgery and check your blood sugar regularly. °• Do not drive while taking prescription pain medication  °• It is ok to take Tylenol by the bottle instructions with your pain medicine or instead of your pain medicine as needed.  DO NOT TAKE NSAIDS (EXAMPLES OF NSAIDS:  IBUPROFREN/ NAPROXEN)  °Diet:                    First 2 Weeks ° You will see the dietician t about two (2) weeks  after your surgery. The dietician will increase the types of foods you can eat if you are handling liquids well: °• If you have severe vomiting or nausea and cannot keep down clear liquids lasting longer than 1 day, call your surgeon @ ([phone]) °Protein Shake °• Drink at least 2 ounces of shake 5-6 times per day °• Each serving of protein shakes (usually 8 - 12 ounces) should have: °o 15 grams of protein  °o And no more than 5 grams of carbohydrate  °• Goal for protein each day: °o Men = 80 grams per day °o Women = 60 grams per day °• Protein powder may be added to fluids such as non-fat milk or Lactaid milk or unsweetened Soy/Almond milk (limit to 35 grams added protein powder per serving) ° °Hydration °• Slowly increase the amount of water and other clear liquids as tolerated (See Acceptable Fluids) °• Slowly increase the amount of protein shake as tolerated  °•  Sip fluids slowly and throughout the day.  Do not use straws. °• May use sugar substitutes in small amounts (no more than 6 - 8 packets per day; i.e. Splenda) ° °Fluid Goal °• The first goal is to drink at least 8 ounces of protein shake/drink per day (or as directed by the nutritionist); some examples of protein shakes are Syntrax Nectar, Adkins Advantage, EAS Edge HP, and Unjury. See handout from pre-op Bariatric Education Class: °o Slowly increase the amount of protein shake you drink as tolerated °o You may find it easier to slowly sip shakes throughout the day °o It is important to get your proteins in first °• Your fluid goal is to drink 64 - 100 ounces of fluid daily °o It may take a few weeks to build up to this °• 32 oz (or more) should be clear liquids  °And  °• 32 oz (or more) should be full liquids (see below for examples) °• Liquids should not contain sugar, caffeine, or carbonation ° °Clear Liquids: °• Water or Sugar-free flavored water (i.e. Fruit H2O, Propel) °• Decaffeinated coffee or tea (sugar-free) °• Crystal Lite, Wyler’s Lite,  Minute Maid Lite °• Sugar-free Jell-O °• Bouillon or broth °• Sugar-free Popsicle:   *Less than 20 calories each; Limit 1 per day ° °Full Liquids: °Protein Shakes/Drinks + 2 choices per day of other full liquids °• Full liquids must be: °o No More Than 15 grams of Carbs per serving  °o No More Than 3 grams of Fat per serving °• Strained low-fat cream soup (except Cream of Potato or Tomato) °• Non-Fat milk °• Fat-free Lactaid Milk °• Unsweetened Soy Or Unsweetened Almond Milk °• Low Sugar yogurt (Dannon Lite & Fit, Greek yogurt; Oikos Triple Zero; Chobani Simply 100; Yoplait 100 calorie Greek - No Fruit on the Bottom) ° °  °Vitamins   and Minerals • Start 1 day after surgery unless otherwise directed by your surgeon °• 2 Chewable Bariatric Specific Multivitamin / Multimineral Supplement with iron (Example: Bariatric Advantage Multi EA) °• Chewable Calcium with Vitamin D-3 °(Example: 3 Chewable Calcium Plus 600 with Vitamin D-3) °o Take 500 mg three (3) times a day for a total of 1500 mg each day °o Do not take all 3 doses of calcium at one time as it may cause constipation, and you can only absorb 500 mg  at a time  °o Do not mix multivitamins containing iron with calcium supplements; take 2 hours apart °• Menstruating women and those with a history of anemia (a blood disease that causes weakness) may need extra iron °o Talk with your doctor to see if you need more iron °• Do not stop taking or change any vitamins or minerals until you talk to your dietitian or surgeon °• Your Dietitian and/or surgeon must approve all vitamin and mineral supplements °  °Activity and Exercise: Limit your physical activity as instructed by your doctor.  It is important to continue walking at home.  During this time, use these guidelines: °• Do not lift anything greater than ten (10) pounds for at least two (2) weeks °• Do not go back to work or drive until your surgeon says you can °• You may have sex when you feel comfortable  °o It is  VERY important for male patients to use a reliable birth control method; fertility often increases after surgery  °o All hormonal birth control will be ineffective for 30 days after surgery due to medications given during surgery a barrier method must be used. °o Do not get pregnant for at least 18 months °• Start exercising as soon as your doctor tells you that you can °o Make sure your doctor approves any physical activity °• Start with a simple walking program °• Walk 5-15 minutes each day, 7 days per week.  °• Slowly increase until you are walking 30-45 minutes per day °Consider joining our BELT program. [phone] or email [email or web address] °  °Special Instructions Things to remember: °• Use your CPAP when sleeping if this applies to you ° °• [facility] has two free Bariatric Surgery Support Groups that meet monthly °o The 3rd Thursday of each month, 6 pm, [facility]  °o The 2nd Friday of each month, 11:45 am in the private dining room in the basement of [facility] °• It is very important to keep all follow up appointments with your surgeon, dietitian, primary care physician, and behavioral health practitioner °• Routine follow up schedule with your surgeon include appointments at 2-3 weeks, 6-8 weeks, 6 months, and 1 year at a minimum.  Your surgeon may request to see you more often.   °o After the first year, please follow up with your bariatric surgeon and dietitian at least once a year in order to maintain best weight loss results °[facility]: [phone] °[facility]: [phone] °Bariatric Nurse Coordinator: [phone] °  °   Reviewed and Endorsed  °by [facility] Patient Education Committee, June, 2016 °Edits Approved: Aug, 2018 ° ° ° °

## 2019-07-23 NOTE — H&P (Signed)
Surgical H&P  CC: obesity  HPI: Follows up today for surgical management of morbid obesity. We initially met in January of this year. He reports no major changes in his health. He has completed 6 months of supervised weight loss and is ready to proceed with surgery. He reports that his meetings with the dietitians have been very helpful for him, and states that he has started to build a framework mentally as far as developing new habits and behavior patterns that can help him sustain success in the future. He has completed the remainder of the bariatric pathway without any obstacles identified. Approved by psychology. Upper GI showed no hiatal hernia although did demonstrate mild reflux. Chest x-ray normal. Labwork unremarkable except for hemoglobin A1c of 8.6 which is aware. He did undergo evaluation for sleep apnea and does have moderate to severe obstructive sleep apnea.  Initial visit 10/08/18: 43yo male presents to discuss bariatric surgery. He has been overweight for essentially his entire life. When he was in his 10s, he weighed approximately 225 pounds but was very athletic. He has continued to gain weight over the years. He has tried numerous different diets and notes that he is successfully stopped losing some weight, however after the diet and see regained that weight that he lost plus more. He is affected by his obesity and many ways- he does have type 2 diabetes which is well controlled with metformin. He does have reflux for which he takes over-the-counter medication. 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 He does admit that he has a significant rent emotional relationship to food, he states that this is his left leg which, in a big part of how he enjoys his life. He does express that he understands he will need to let that go and  change that behavior although admittedly will be difficult.  He is married, with 4 children ages 40, 75, 30, 60. He feels that he has a good support system to go through bariatric surgery. Works as Copy at [employer]. Denies tobacco or drug use, drinks very occasionally a small amount.  Wt 309.13lb, BMI 43.73. He is a good candidate for sleeve gastrectomy. We discussed the surgery including technical aspects, the risks of bleeding, infection, pain, scarring, injury to intra-abdominal structures, staple line leak or abscess, chronic abdominal pain or nausea, worsened GERD which on very rare occasions can require conversion to bypass, DVT/PE, pneumonia, heart attack, stroke, death, failure to reach weight loss goals and weight regain, hernia. Discussed the typical pre-, peri-, and postoperative course. Discussed the importance of lifelong behavioral changes to combat the chronic and relapsing disease which is obesity. It is very encouraging that he already understands and acknowledges and emotional relationship with food and is willing to work on changing that. We discussed starting by observing his behaviors and thoughts and noticing where he can start making changes. He had several very insightful questions which were answered to his satisfaction. Will start him on the bariatric pathway.  No Known Allergies      Past Medical History:  Diagnosis Date  . Anxiety   . GERD (gastroesophageal reflux disease)   . Hemorrhoids   . Obesity   . Obesity   . Sleep apnea   . Type 2 diabetes mellitus ([place])  Past Surgical History:  Procedure Laterality Date  . APPENDECTOMY           Family History  Problem Relation Age of Onset  . Hypertension Mother     Social History   Socioeconomic History  . Marital status: Married    Spouse name: Not on file  . Number of children: 4  . Years of education: Not on file  . Highest education level:  Not on file  Occupational History  . Not on file  Social Needs  . Financial resource strain: Not on file  . Food insecurity    Worry: Not on file    Inability: Not on file  . Transportation needs    Medical: Not on file    Non-medical: Not on file  Tobacco Use  . Smoking status: Never Smoker  . Smokeless tobacco: Never Used  Substance and Sexual Activity  . Alcohol use: Never    Frequency: Never  . Drug use: Not on file  . Sexual activity: Not on file  Lifestyle  . Physical activity    Days per week: Not on file    Minutes per session: Not on file  . Stress: Not on file  Relationships  . Social Herbalist on phone: Not on file    Gets together: Not on file    Attends religious service: Not on file    Active member of club or organization: Not on file    Attends meetings of clubs or organizations: Not on file    Relationship status: Not on file  Other Topics Concern  . Not on file  Social History Narrative   3-4 diet sodas daily   R handed          Current Outpatient Medications on File Prior to Visit  Medication Sig Dispense Refill  . busPIRone (BUSPAR) 5 MG tablet Take 5 mg by mouth 2 (two) times daily.    Marland Kitchen lisinopril (PRINIVIL,ZESTRIL) 2.5 MG tablet Take 2.5 mg by mouth daily.    . metFORMIN (GLUCOPHAGE-XR) 500 MG 24 hr tablet Take 500 mg by mouth 2 (two) times daily.    Marland Kitchen OMEPRAZOLE PO Take by mouth.    Marland Kitchen PARoxetine (PAXIL) 40 MG tablet Take 40 mg by mouth daily.     No current facility-administered medications on file prior to visit.     Review of Systems: a complete, 10pt review of systems was completed with pertinent positives and negatives as documented in the HPI  Physical Exam: Vitals  Weight: 306.2 lb Height: 70in Body Surface Area: 2.5 m Body Mass Index: 43.93 kg/m  Temp.: 97.76F  Pulse: 114 (Regular)  P.OX: 93% (Room air) BP: 150/100 (Sitting, Left Arm, Standard)  Alert and  well-appearing. Unlabored respirations. Extraocular movements intact.   A/P: MORBID OBESITY (E66.01) Story: Remains an excellent candidate for sleeve gastrectomy and seems to have made good use of the counseling he received while meeting with the dietitians during the period of supervised weight loss. We went over again today the typical perioperative course and the risks of surgery. He had several insightful questions which were answered to his satisfaction. He has an excellent support network and is ready to proceed.   Romana Juniper, MD [facility], [place] Pager [phone]

## 2019-07-23 NOTE — Progress Notes (Signed)
PHARMACY CONSULT FOR:  Risk Assessment for Post-Discharge VTE Following Bariatric Surgery  Post-Discharge VTE Risk Assessment: This patient's probability of 30-day post-discharge VTE is increased due to the factors marked: x  Male    Age >/=60 years    BMI >/=50 kg/m2    CHF    Dyspnea at Rest    Paraplegia  x  Non-gastric-band surgery    Operation Time >/=3 hr    Return to OR     Length of Stay >/= 3 d      Hx of VTE   Hypercoagulable condition   Significant venous stasis   Predicted probability of 30-day post-discharge VTE: 0.31%  Other patient-specific factors to consider:   Recommendation for Discharge: No pharmacologic prophylaxis post-discharge    Malik Ramos is a 43 y.o. male who underwent laparoscopic sleeve gastrectomy on 07/23/19   Case start: 1009 Case end: 1137   Allergies  Allergen Reactions  . Codeine Nausea And Vomiting    Patient Measurements:   There is no height or weight on file to calculate BMI.  No results for input(s): WBC, HGB, HCT, PLT, APTT, CREATININE, LABCREA, CREATININE, CREAT24HRUR, MG, PHOS, ALBUMIN, PROT, ALBUMIN, AST, ALT, ALKPHOS, BILITOT, BILIDIR, IBILI in the last 72 hours. Estimated Creatinine Clearance: 173.6 mL/min (by C-G formula based on SCr of 0.71 mg/dL).    Past Medical History:  Diagnosis Date  . Anxiety   . Fatty liver   . GERD (gastroesophageal reflux disease)   . Hemorrhoids   . History of kidney stones   . Obesity   . Peripheral neuropathy   . Sleep apnea    does not have a CPAP machine  . Type 2 diabetes mellitus ([facility])      Medications Prior to Admission  Medication Sig Dispense Refill Last Dose  . aspirin EC 81 MG tablet Take 81 mg by mouth at bedtime.   Past Week at Unknown time  . cetirizine (ZYRTEC) 10 MG tablet Take 10 mg by mouth daily as needed for allergies.   Past Week at Unknown time  . ibuprofen (ADVIL) 200 MG tablet Take 200 mg by mouth every 6 (six) hours as needed.   Past Week at  Unknown time  . metFORMIN (GLUCOPHAGE-XR) 500 MG 24 hr tablet Take 2,000 mg by mouth at bedtime.    07/22/2019 at Unknown time  . omeprazole (PRILOSEC) 10 MG capsule Take 10 mg by mouth at bedtime.    07/22/2019 at Unknown time  . PARoxetine (PAXIL) 40 MG tablet Take 40 mg by mouth at bedtime.    07/22/2019 at Unknown time       Kara Mead 07/23/2019,2:21 PM

## 2019-07-23 NOTE — Anesthesia Postprocedure Evaluation (Signed)
Anesthesia Post Note  Patient: Malik Ramos  Procedure(s) Performed: LAPAROSCOPIC GASTRIC SLEEVE RESECTION, Upper Endo, ERAS Pathway (N/A Abdomen)     Patient location during evaluation: PACU Anesthesia Type: General Level of consciousness: awake and alert Pain management: pain level controlled Vital Signs Assessment: post-procedure vital signs reviewed and stable Respiratory status: spontaneous breathing, nonlabored ventilation, respiratory function stable and patient connected to nasal cannula oxygen Cardiovascular status: blood pressure returned to baseline and stable Postop Assessment: no apparent nausea or vomiting Anesthetic complications: no    Last Vitals:  Vitals:   07/23/19 1259 07/23/19 1319  BP: (!) 167/88 (!) 164/87  Pulse: 67 72  Resp: 18 16  Temp: 36.9 C 37.6 C  SpO2: 94% 94%    Last Pain:  Vitals:   07/23/19 1319  TempSrc: Oral  PainSc:                  Barnet Glasgow

## 2019-07-23 NOTE — Progress Notes (Signed)
Discussed post op day goals with patient including ambulation, IS, diet progression, pain, and nausea control.  BSTOP education provided including BSTOP information guide, "Guide for Pain Management after your Bariatric Procedure".  Questions answered. 

## 2019-07-24 ENCOUNTER — Encounter (HOSPITAL_COMMUNITY): Payer: Self-pay

## 2019-07-24 ENCOUNTER — Encounter (HOSPITAL_COMMUNITY): Payer: Self-pay | Admitting: Surgery

## 2019-07-24 DIAGNOSIS — K760 Fatty (change of) liver, not elsewhere classified: Secondary | ICD-10-CM | POA: Diagnosis not present

## 2019-07-24 DIAGNOSIS — Z8249 Family history of ischemic heart disease and other diseases of the circulatory system: Secondary | ICD-10-CM | POA: Diagnosis not present

## 2019-07-24 DIAGNOSIS — E1165 Type 2 diabetes mellitus with hyperglycemia: Secondary | ICD-10-CM | POA: Diagnosis not present

## 2019-07-24 DIAGNOSIS — F419 Anxiety disorder, unspecified: Secondary | ICD-10-CM | POA: Diagnosis not present

## 2019-07-24 DIAGNOSIS — Z885 Allergy status to narcotic agent status: Secondary | ICD-10-CM | POA: Diagnosis not present

## 2019-07-24 DIAGNOSIS — G4733 Obstructive sleep apnea (adult) (pediatric): Secondary | ICD-10-CM | POA: Diagnosis not present

## 2019-07-24 DIAGNOSIS — Z79899 Other long term (current) drug therapy: Secondary | ICD-10-CM | POA: Diagnosis not present

## 2019-07-24 DIAGNOSIS — Z6841 Body Mass Index (BMI) 40.0 and over, adult: Secondary | ICD-10-CM | POA: Diagnosis not present

## 2019-07-24 DIAGNOSIS — K219 Gastro-esophageal reflux disease without esophagitis: Secondary | ICD-10-CM | POA: Diagnosis not present

## 2019-07-24 DIAGNOSIS — I10 Essential (primary) hypertension: Secondary | ICD-10-CM | POA: Diagnosis not present

## 2019-07-24 DIAGNOSIS — Z791 Long term (current) use of non-steroidal anti-inflammatories (NSAID): Secondary | ICD-10-CM | POA: Diagnosis not present

## 2019-07-24 DIAGNOSIS — M2550 Pain in unspecified joint: Secondary | ICD-10-CM | POA: Diagnosis not present

## 2019-07-24 DIAGNOSIS — Z7982 Long term (current) use of aspirin: Secondary | ICD-10-CM | POA: Diagnosis not present

## 2019-07-24 DIAGNOSIS — Z794 Long term (current) use of insulin: Secondary | ICD-10-CM | POA: Diagnosis not present

## 2019-07-24 LAB — CBC WITH DIFFERENTIAL/PLATELET
Abs Immature Granulocytes: 0.1 10*3/uL — ABNORMAL HIGH (ref 0.00–0.07)
Basophils Absolute: 0 10*3/uL (ref 0.0–0.1)
Basophils Relative: 0 %
Eosinophils Absolute: 0 10*3/uL (ref 0.0–0.5)
Eosinophils Relative: 0 %
HCT: 44.1 % (ref 39.0–52.0)
Hemoglobin: 14.7 g/dL (ref 13.0–17.0)
Immature Granulocytes: 1 %
Lymphocytes Relative: 16 %
Lymphs Abs: 2 10*3/uL (ref 0.7–4.0)
MCH: 28.5 pg (ref 26.0–34.0)
MCHC: 33.3 g/dL (ref 30.0–36.0)
MCV: 85.6 fL (ref 80.0–100.0)
Monocytes Absolute: 0.8 10*3/uL (ref 0.1–1.0)
Monocytes Relative: 6 %
Neutro Abs: 9.4 10*3/uL — ABNORMAL HIGH (ref 1.7–7.7)
Neutrophils Relative %: 77 %
Platelets: 225 10*3/uL (ref 150–400)
RBC: 5.15 MIL/uL (ref 4.22–5.81)
RDW: 13.2 % (ref 11.5–15.5)
WBC: 12.3 10*3/uL — ABNORMAL HIGH (ref 4.0–10.5)
nRBC: 0 % (ref 0.0–0.2)

## 2019-07-24 LAB — COMPREHENSIVE METABOLIC PANEL
ALT: 130 U/L — ABNORMAL HIGH (ref 0–44)
AST: 58 U/L — ABNORMAL HIGH (ref 15–41)
Albumin: 3.9 g/dL (ref 3.5–5.0)
Alkaline Phosphatase: 60 U/L (ref 38–126)
Anion gap: 10 (ref 5–15)
BUN: 15 mg/dL (ref 6–20)
CO2: 23 mmol/L (ref 22–32)
Calcium: 9 mg/dL (ref 8.9–10.3)
Chloride: 104 mmol/L (ref 98–111)
Creatinine, Ser: 0.76 mg/dL (ref 0.61–1.24)
GFR calc Af Amer: >60 mL/min (ref >60)
GFR calc non Af Amer: >60 mL/min (ref >60)
Glucose, Bld: 193 mg/dL — ABNORMAL HIGH (ref 70–99)
Potassium: 3.9 mmol/L (ref 3.5–5.1)
Sodium: 137 mmol/L (ref 135–145)
Total Bilirubin: 0.6 mg/dL (ref 0.3–1.2)
Total Protein: 7 g/dL (ref 6.5–8.1)

## 2019-07-24 LAB — MAGNESIUM: Magnesium: 2.2 mg/dL (ref 1.7–2.4)

## 2019-07-24 LAB — GLUCOSE, CAPILLARY
Glucose-Capillary: 186 mg/dL — ABNORMAL HIGH (ref 70–99)
Glucose-Capillary: 212 mg/dL — ABNORMAL HIGH (ref 70–99)
Glucose-Capillary: 219 mg/dL — ABNORMAL HIGH (ref 70–99)

## 2019-07-24 MED ORDER — TRAMADOL HCL 50 MG PO TABS: 50.0000 mg | ORAL_TABLET | Freq: Four times a day (QID) | ORAL | 0 refills | Status: AC | PRN

## 2019-07-24 MED ORDER — ONDANSETRON 4 MG PO TBDP: 4.0000 mg | ORAL_TABLET | Freq: Four times a day (QID) | ORAL | 0 refills | Status: AC | PRN

## 2019-07-24 MED ORDER — METFORMIN HCL 500 MG PO TABS: 500.0000 mg | ORAL_TABLET | Freq: Three times a day (TID) | ORAL | 11 refills | Status: AC

## 2019-07-24 MED ORDER — PANTOPRAZOLE SODIUM 40 MG PO TBEC: 40.0000 mg | DELAYED_RELEASE_TABLET | Freq: Every day | ORAL | 0 refills | Status: AC

## 2019-07-24 MED ORDER — ACETAMINOPHEN 500 MG PO TABS: 1000.0000 mg | ORAL_TABLET | Freq: Three times a day (TID) | ORAL | 0 refills | Status: AC

## 2019-07-24 MED ORDER — GABAPENTIN 100 MG PO CAPS: 200.0000 mg | ORAL_CAPSULE | Freq: Two times a day (BID) | ORAL | 0 refills | Status: AC

## 2019-07-24 NOTE — Progress Notes (Signed)
Patient alert and oriented, pain is controlled. Patient is tolerating fluids, advanced to protein shake today, patient is tolerating well.  Reviewed Gastric sleeve discharge instructions with patient and patient is able to articulate understanding.  Provided information on BELT program, Support Group and [facility] outpatient pharmacy. All questions answered, will continue to monitor.  Total fluid intake 720 Per dehydration protocol call back one week post op 

## 2019-07-24 NOTE — Progress Notes (Signed)
S: No acute events, pain well controlled, no nausea. Some epigastric pain with swallowing. Walked 15 laps since arriving to the floor.   PRN meds rec'd: zofran x 1 2pm, oxycodone x 1 8pm Sch- tylenol, gabapentin  Vitals, labs, intake/output, and orders reviewed at this time. Afebrile, no tachycardia, hypertensive until most recent check which was 128/59. Sats 93-96% on RA. 300 PO, 2850 UOP. CMP unremarkable, mild increase LFTs from liver retraction. WBC 12.3 (6.8 preop), hgb 14.7 (15.2), PLT 225 (222). CBG above 300 all day yesterday, 186 this morning.  Gen: A&Ox3, no distress  H&N: EOMI, atraumatic, neck supple Chest: unlabored respirations, RRR Abd: soft, nontender, nondistended, incision c/d/i with steris/ bandaids. Minimal hematoma/ecchymosis at right lateral port site.  Ext: warm, no edema Neuro: grossly normal  Lines/tubes/drains: PIV  A/P:  POD 1 sleeve gastrectomy, doing well Diabetes- hg A1C 8.6, hyperglycemia on admission. Received insulin preop and in pacu, since arriving to floor yesterday afternoon has rec'd 61 units of insulin- requirement has gone down significantly this AM. Will continue metformin on DC will need close follow up with PCP  Hypertension- a little better this AM, not on any meds at home, follow up with PCP Continue clears/ protein shakes Ambulate Aggressive pulmonary toilet Continue lovenox/ SCDs  Plan discharge home today  Romana Juniper, MD [facility], [place] Pager [phone]

## 2019-07-24 NOTE — Progress Notes (Signed)
Nutrition Note  RD consulted for diet education for patient s/p bariatric surgery.  At this time, Bariatric nurse coordinator providing post-op education.  If nutrition issues arise, please consult RD.   Jayden Kratochvil, MS, RD, LDN Inpatient Clinical Dietitian Pager: [phone] After Hours Pager: [phone]  

## 2019-07-24 NOTE — Discharge Summary (Signed)
Physician Discharge Summary  Malik Ramos EZM:[id number] DOB: August 18, 1976 DOA: 07/23/2019  PCP: Malik Lemons, PA  Admit date: 07/23/2019 Discharge date: 07/24/2019   Recommendations for Outpatient Follow-up:   Follow-up Information    Malik Riley, MD. Go on 08/21/2019.   Specialty: General Surgery Why: at 1050 Contact information: [address] [place] [address] [phone]        Surgery, Malik Ramos. Go on 09/11/2019.   Specialty: General Surgery Why: at [address]  Contact information: Malik Ramos [address] [phone]          Discharge Diagnoses:  Active Problems:   Morbid obesity ([place])   Surgical Procedure: Laparoscopic Sleeve Gastrectomy, upper endoscopy  Discharge Condition: Good Disposition: Home  Diet recommendation: Postoperative sleeve gastrectomy diet (liquids only)  There were no vitals filed for this visit.   Hospital Course:  The patient was admitted for a planned laparoscopic sleeve gastrectomy. Please see operative note. Preoperatively the patient was given lovenox for DVT prophylaxis. Postoperative prophylactic Lovenox dosing was started on the evening of postoperative day 0. ERAS protocol was used. On the evening of postoperative day 0, the patient was started on water and ice chips. On postoperative day 1 the patient had no fever or tachycardia and was tolerating water in their diet was gradually advanced throughout the day. The patient was ambulating without difficulty. Their vital signs are stable without fever or tachycardia. Their hemoglobin had remained stable. The patient had received discharge instructions and counseling. They were deemed stable for discharge and had met discharge criteria   Discharge Instructions  Discharge Instructions    Ambulate hourly while awake   Complete by: As directed    Call MD for:  difficulty breathing, headache or visual disturbances    Complete by: As directed    Call MD for:  persistant dizziness or light-headedness   Complete by: As directed    Call MD for:  persistant nausea and vomiting   Complete by: As directed    Call MD for:  redness, tenderness, or signs of infection (pain, swelling, redness, odor or green/yellow discharge around incision site)   Complete by: As directed    Call MD for:  severe uncontrolled pain   Complete by: As directed    Call MD for:  temperature >101 F   Complete by: As directed    Incentive spirometry   Complete by: As directed    Perform hourly while awake     Allergies as of 07/24/2019      Reactions   Codeine Nausea And Vomiting      Medication List    STOP taking these medications   aspirin EC 81 MG tablet   ibuprofen 200 MG tablet Commonly known as: ADVIL   metFORMIN 500 MG 24 hr tablet Commonly known as: GLUCOPHAGE-XR Replaced by: metFORMIN 500 MG tablet   omeprazole 10 MG capsule Commonly known as: PRILOSEC     TAKE these medications   acetaminophen 500 MG tablet Commonly known as: TYLENOL Take 2 tablets (1,000 mg total) by mouth every 8 (eight) hours for 5 days.   cetirizine 10 MG tablet Commonly known as: ZYRTEC Take 10 mg by mouth daily as needed for allergies.   gabapentin 100 MG capsule Commonly known as: NEURONTIN Take 2 capsules (200 mg total) by mouth every 12 (twelve) hours.   metFORMIN 500 MG tablet Commonly known as: Glucophage Take 1 tablet (500 mg total) by mouth 4 (  four) times daily - after meals and at bedtime. Check blood sugar frequently as you may need to change this medication quickly after surgery. Follow up with your primary care doctor regarding titration of this medication. Replaces: metFORMIN 500 MG 24 hr tablet   ondansetron 4 MG disintegrating tablet Commonly known as: ZOFRAN-ODT Take 1 tablet (4 mg total) by mouth every 6 (six) hours as needed for nausea or vomiting.   pantoprazole 40 MG tablet Commonly known as:  PROTONIX Take 1 tablet (40 mg total) by mouth daily.   PARoxetine 40 MG tablet Commonly known as: PAXIL Take 40 mg by mouth at bedtime.   traMADol 50 MG tablet Commonly known as: ULTRAM Take 1 tablet (50 mg total) by mouth every 6 (six) hours as needed (pain).      Follow-up Information    Malik Riley, MD. Go on 08/21/2019.   Specialty: General Surgery Why: at 1050 Contact information: [address] [place] [address] [phone]        Surgery, [place]. Go on 09/11/2019.   Specialty: General Surgery Why: at 950  Contact information: Malik Ramos [address] [phone]            The results of significant diagnostics from this hospitalization (including imaging, microbiology, ancillary and laboratory) are listed below for reference.    Significant Diagnostic Studies: No results found.  Labs: Basic Metabolic Panel: Recent Labs  Lab 07/19/19 1029 07/24/19 0229  NA 135 137  K 3.9 3.9  CL 102 104  CO2 23 23  GLUCOSE 303* 193*  BUN 12 15  CREATININE 0.71 0.76  CALCIUM 9.1 9.0  MG  --  2.2   Liver Function Tests: Recent Labs  Lab 07/19/19 1029 07/24/19 0229  AST 80* 58*  ALT 136* 130*  ALKPHOS 63 60  BILITOT 1.1 0.6  PROT 7.4 7.0  ALBUMIN 4.3 3.9    CBC: Recent Labs  Lab 07/19/19 1029 07/24/19 0229  WBC 6.8 12.3*  NEUTROABS 3.8 9.4*  HGB 15.2 14.7  HCT 45.2 44.1  MCV 84.8 85.6  PLT 222 225    CBG: Recent Labs  Lab 07/23/19 1928 07/23/19 2109 07/23/19 2345 07/24/19 0413 07/24/19 0730  GLUCAP 356* 245* 220* 186* 212*    Active Problems:   Morbid obesity ([place])    Signed:  Clovis Ramos, [facility], [place] 07/24/2019, 8:06 AM

## 2019-07-24 NOTE — Progress Notes (Signed)
Patient alert and oriented, Post op day 1.  Provided support and encouragement.  Encouraged pulmonary toilet, ambulation and small sips of liquids.  All questions answered.  Will continue to monitor. 

## 2019-07-29 ENCOUNTER — Telehealth: Payer: Self-pay | Admitting: Skilled Nursing Facility1

## 2019-07-29 ENCOUNTER — Telehealth (HOSPITAL_COMMUNITY): Payer: Self-pay

## 2019-07-29 LAB — SURGICAL PATHOLOGY

## 2019-07-29 NOTE — Telephone Encounter (Signed)
Absolutly!  From: Raynald Blend [email or web address]>  Sent: Sunday, July 28, 2019 8:23 PM To: Scotece, Alexis @ [email or web address]> Subject: RE: Bariatric Vitamins  Gaylan Gerold,  Thursday I'll be a week and a half out from surgery. Still in liquids phase. Can I have chicken and dumplings, just the broth - no lumps.   Raynald Blend

## 2019-07-29 NOTE — Telephone Encounter (Signed)
Patient called to discuss post bariatric surgery follow up questions.  See below:   1.  Tell me about your pain and pain management?denies  2.  Let's talk about fluid intake.  How much total fluid are you taking in?66 ounces  3.  How much protein have you taken in the last 2 days?70 grams  4.  Have you had nausea?  Tell me about when have experienced nausea and what you did to help?denies  5.  Has the frequency or color changed with your urine?frequent urination  6.  Tell me what your incisions look like?no problems only itchy  7.  Have you been passing gas? BM?having bm's stopped eating so many popsicles because was having at of bm's  8.  If a problem or question were to arise who would you call?  Do you know contact numbers for [facility], [facility], and [facility]?aware of how to contact all services  9.  How has the walking going?walking 30 minutes daily and around the house  10.  How are your vitamins and calcium going?  How are you taking them?taking without problems as directed

## 2019-08-06 ENCOUNTER — Other Ambulatory Visit: Payer: Self-pay

## 2019-08-06 ENCOUNTER — Encounter: Payer: BC Managed Care – PPO | Attending: Surgery | Admitting: Skilled Nursing Facility1

## 2019-08-06 DIAGNOSIS — E669 Obesity, unspecified: Secondary | ICD-10-CM | POA: Diagnosis not present

## 2019-08-07 NOTE — Progress Notes (Signed)
2 Week Post-Operative Nutrition Class   Patient was seen on 11/06/18 for Post-Operative Nutrition education at the [facility].    Surgery date: 07/22/2019 Surgery type: sleeve Start weight at [facility]: 301.7 Weight today: 292.3   Body Composition Scale declined  Total Body Fat %   Visceral Fat   Fat-Free Mass %    Total Body Water %    Muscle-Mass lbs   Body Fat Displacement          Torso  lbs          Left Leg  lbs          Right Leg  lbs          Left Arm  lbs          Right Arm   lbs      The following the learning objectives were met by the patient during this course:  Identifies Phase 3 (Soft, High Proteins) Dietary Goals and will begin from 2 weeks post-operatively to 2 months post-operatively  Identifies appropriate sources of fluids and proteins   States protein recommendations and appropriate sources post-operatively  Identifies the need for appropriate texture modifications, mastication, and bite sizes when consuming solids  Identifies appropriate multivitamin and calcium sources post-operatively  Describes the need for physical activity post-operatively and will follow MD recommendations  States when to call healthcare provider regarding medication questions or post-operative complications   Handouts given during class include:  Phase 3A: Soft, High Protein Diet Handout   Follow-Up Plan: Patient will follow-up at [facility] in 6 weeks for 2 month post-op nutrition visit for diet advancement per MD.

## 2019-08-11 ENCOUNTER — Other Ambulatory Visit: Payer: Self-pay

## 2019-08-11 ENCOUNTER — Emergency Department (HOSPITAL_COMMUNITY): Payer: BC Managed Care – PPO

## 2019-08-11 ENCOUNTER — Emergency Department (HOSPITAL_COMMUNITY)
Admission: EM | Admit: 2019-08-11 | Discharge: 2019-08-11 | Disposition: A | Discharge status: Home / Self Care | Payer: BC Managed Care – PPO | Attending: Emergency Medicine | Admitting: Emergency Medicine

## 2019-08-11 DIAGNOSIS — E119 Type 2 diabetes mellitus without complications: Secondary | ICD-10-CM | POA: Insufficient documentation

## 2019-08-11 DIAGNOSIS — M19071 Primary osteoarthritis, right ankle and foot: Secondary | ICD-10-CM | POA: Diagnosis not present

## 2019-08-11 DIAGNOSIS — Z23 Encounter for immunization: Secondary | ICD-10-CM | POA: Diagnosis not present

## 2019-08-11 DIAGNOSIS — M79671 Pain in right foot: Secondary | ICD-10-CM | POA: Diagnosis not present

## 2019-08-11 DIAGNOSIS — L03115 Cellulitis of right lower limb: Secondary | ICD-10-CM

## 2019-08-11 DIAGNOSIS — Z7984 Long term (current) use of oral hypoglycemic drugs: Secondary | ICD-10-CM | POA: Insufficient documentation

## 2019-08-11 DIAGNOSIS — Z79899 Other long term (current) drug therapy: Secondary | ICD-10-CM | POA: Insufficient documentation

## 2019-08-11 LAB — CBC WITH DIFFERENTIAL/PLATELET
Abs Immature Granulocytes: 0.03 10*3/uL (ref 0.00–0.07)
Basophils Absolute: 0 10*3/uL (ref 0.0–0.1)
Basophils Relative: 0 %
Eosinophils Absolute: 0.3 10*3/uL (ref 0.0–0.5)
Eosinophils Relative: 2 %
HCT: 45.2 % (ref 39.0–52.0)
Hemoglobin: 14.4 g/dL (ref 13.0–17.0)
Immature Granulocytes: 0 %
Lymphocytes Relative: 22 %
Lymphs Abs: 2.4 10*3/uL (ref 0.7–4.0)
MCH: 28.5 pg (ref 26.0–34.0)
MCHC: 31.9 g/dL (ref 30.0–36.0)
MCV: 89.3 fL (ref 80.0–100.0)
Monocytes Absolute: 0.8 10*3/uL (ref 0.1–1.0)
Monocytes Relative: 7 %
Neutro Abs: 7.6 10*3/uL (ref 1.7–7.7)
Neutrophils Relative %: 69 %
Platelets: 227 10*3/uL (ref 150–400)
RBC: 5.06 MIL/uL (ref 4.22–5.81)
RDW: 13.2 % (ref 11.5–15.5)
WBC: 11.1 10*3/uL — ABNORMAL HIGH (ref 4.0–10.5)
nRBC: 0 % (ref 0.0–0.2)

## 2019-08-11 LAB — BASIC METABOLIC PANEL
Anion gap: 10 (ref 5–15)
BUN: 15 mg/dL (ref 6–20)
CO2: 25 mmol/L (ref 22–32)
Calcium: 9.7 mg/dL (ref 8.9–10.3)
Chloride: 102 mmol/L (ref 98–111)
Creatinine, Ser: 0.83 mg/dL (ref 0.61–1.24)
GFR calc Af Amer: >60 mL/min (ref >60)
GFR calc non Af Amer: >60 mL/min (ref >60)
Glucose, Bld: 136 mg/dL — ABNORMAL HIGH (ref 70–99)
Potassium: 3.8 mmol/L (ref 3.5–5.1)
Sodium: 137 mmol/L (ref 135–145)

## 2019-08-11 LAB — LACTIC ACID, PLASMA: Lactic Acid, Venous: 1.3 mmol/L (ref 0.5–1.9)

## 2019-08-11 MED ORDER — CLINDAMYCIN HCL 300 MG PO CAPS: 300.0000 mg | ORAL_CAPSULE | Freq: Four times a day (QID) | ORAL | 0 refills | Status: AC

## 2019-08-11 MED ORDER — TETANUS-DIPHTH-ACELL PERTUSSIS 5-2.5-18.5 LF-MCG/0.5 IM SUSP
0.5000 mL | Freq: Once | INTRAMUSCULAR | Status: AC
  Administered 2019-08-11: 0.5 mL via INTRAMUSCULAR
  Filled 2019-08-11: qty 0.5

## 2019-08-11 MED ORDER — VANCOMYCIN HCL IN DEXTROSE 1-5 GM/200ML-% IV SOLN
1000.0000 mg | Freq: Once | INTRAVENOUS | Status: AC
  Administered 2019-08-11: 1000 mg via INTRAVENOUS
  Filled 2019-08-11: qty 200

## 2019-08-11 NOTE — ED Provider Notes (Signed)
[facility]-EMERGENCY DEPT Provider Note   CSN: [id number] Arrival date & time: 08/11/19  1736     History   Chief Complaint Chief Complaint  Patient presents with  . Foot Pain    HPI Malik Ramos is a 43 y.o. male.     Patient is a 43 year old male with a history of type 2 diabetes and recent gastric bypass surgery about 3 weeks ago who presents with swelling and redness to his right foot.  He says that he started having some ankle pain last night.  He noticed it was a little red around his medial ankle and throughout the day it spread around his ankle and the redness is extended to his foot.  There is no redness that extends into the lower leg.  There is no calf pain or swelling of the leg.  No known fevers although he had a temperature of 100.7 on arrival to the ED.  No nausea or vomiting.  No other cough or cold symptoms.  He denies any wounds to the area.     Past Medical History:  Diagnosis Date  . Anxiety   . Fatty liver   . GERD (gastroesophageal reflux disease)   . Hemorrhoids   . History of kidney stones   . Obesity   . Peripheral neuropathy   . Sleep apnea    does not have a CPAP machine  . Type 2 diabetes mellitus [facility](HCC)     Patient Active Problem List   Diagnosis Date Noted  . Morbid obesity (HCC) 07/23/2019  . Type 2 diabetes mellitus (HCC)   . Sleep apnea   . Obesity     Past Surgical History:  Procedure Laterality Date  . ANAL FISSURE REPAIR    . APPENDECTOMY    . HERNIA REPAIR    . LAPAROSCOPIC GASTRIC SLEEVE RESECTION N/A 07/23/2019   Procedure: LAPAROSCOPIC GASTRIC SLEEVE RESECTION, Upper Endo, ERAS Pathway;  Surgeon: Berna Bueonnor, Chelsea A, MD;  Location: [facility] ORS;  Service: General;  Laterality: N/A;        Home Medications    Prior to Admission medications   Medication Sig Start Date End Date Taking? Authorizing Provider  cetirizine (ZYRTEC) 10 MG tablet Take 10 mg by mouth daily as needed for allergies.    [provider]  clindamycin (CLEOCIN) 300 MG capsule Take 1 capsule (300 mg total) by mouth 4 (four) times daily. X 7 days 08/11/19   Rolan BuccoBelfi, Louiza Moor, MD  gabapentin (NEURONTIN) 100 MG capsule Take 2 capsules (200 mg total) by mouth every 12 (twelve) hours. 07/24/19   Berna Bueonnor, Chelsea A, MD  metFORMIN (GLUCOPHAGE) 500 MG tablet Take 1 tablet (500 mg total) by mouth 4 (four) times daily - after meals and at bedtime. Check blood sugar frequently as you may need to change this medication quickly after surgery. Follow up with your primary care doctor regarding titration of this medication. 07/24/19 07/23/20  Berna Bueonnor, Chelsea A, MD  ondansetron (ZOFRAN-ODT) 4 MG disintegrating tablet Take 1 tablet (4 mg total) by mouth every 6 (six) hours as needed for nausea or vomiting. 07/24/19   Berna Bueonnor, Chelsea A, MD  pantoprazole (PROTONIX) 40 MG tablet Take 1 tablet (40 mg total) by mouth daily. 07/24/19   Berna Bueonnor, Chelsea A, MD  PARoxetine (PAXIL) 40 MG tablet Take 40 mg by mouth at bedtime.  11/16/18   [provider]  traMADol (ULTRAM) 50 MG tablet Take 1 tablet (50 mg total) by mouth every 6 (six) hours  as needed (pain). 07/24/19   Berna Bue, MD    Family History Family History  Problem Relation Age of Onset  . Hypertension Mother     Social History Social History   Tobacco Use  . Smoking status: Never Smoker  . Smokeless tobacco: Never Used  Substance Use Topics  . Alcohol use: Yes    Frequency: Never    Comment: rare  . Drug use: Never     Allergies   Codeine   Review of Systems Review of Systems  Constitutional: Positive for fever. Negative for chills, diaphoresis and fatigue.  HENT: Negative for congestion, rhinorrhea and sneezing.   Eyes: Negative.   Respiratory: Negative for cough, chest tightness and shortness of breath.   Cardiovascular: Negative for chest pain and leg swelling.  Gastrointestinal: Negative for abdominal pain, blood in stool, diarrhea, nausea and  vomiting.  Genitourinary: Negative for difficulty urinating, flank pain, frequency and hematuria.  Musculoskeletal: Positive for arthralgias. Negative for back pain.  Skin: Positive for color change. Negative for rash.  Neurological: Negative for dizziness, speech difficulty, weakness, numbness and headaches.     Physical Exam Updated Vital Signs BP (!) 150/83 (BP Location: Left Arm)   Pulse (!) 104   Temp (!) 100.7 F (38.2 C) (Oral)   Resp 18   SpO2 97%   Physical Exam Constitutional:      Appearance: He is well-developed.  HENT:     Head: Normocephalic and atraumatic.  Eyes:     Pupils: Pupils are equal, round, and reactive to light.  Neck:     Musculoskeletal: Normal range of motion and neck supple.  Cardiovascular:     Rate and Rhythm: Normal rate and regular rhythm.     Heart sounds: Normal heart sounds.  Pulmonary:     Effort: Pulmonary effort is normal. No respiratory distress.     Breath sounds: Normal breath sounds. No wheezing or rales.  Chest:     Chest wall: No tenderness.  Abdominal:     General: Bowel sounds are normal.     Palpations: Abdomen is soft.     Tenderness: There is no abdominal tenderness. There is no guarding or rebound.  Musculoskeletal: Normal range of motion.     Comments: Patient has erythema around his calcaneus and extending to the dorsum and medial aspect of the right foot.  There is no extension of the redness past the ankle.  No calf tenderness.  No swelling of the lower leg.  No bony tenderness is noted to his foot.  Pedal pulses are intact.  No open wounds are noted although he has dry scaly skin over his calcaneus.  Lymphadenopathy:     Cervical: No cervical adenopathy.  Skin:    General: Skin is warm and dry.     Findings: No rash.  Neurological:     Mental Status: He is alert and oriented to person, place, and time.      ED Treatments / Results  Labs (all labs ordered are listed, but only abnormal results are displayed)  Labs Reviewed  BASIC METABOLIC PANEL - Abnormal; Notable for the following components:      Result Value   Glucose, Bld 136 (*)    All other components within normal limits  CBC WITH DIFFERENTIAL/PLATELET - Abnormal; Notable for the following components:   WBC 11.1 (*)    All other components within normal limits  CULTURE, BLOOD (ROUTINE X 2)  CULTURE, BLOOD (ROUTINE X 2)  LACTIC ACID,  PLASMA    EKG None  Radiology Dg Foot Complete Right  Result Date: 08/11/2019 CLINICAL DATA:  Patient with foot pain.  Fever.  No known injury. EXAM: RIGHT FOOT COMPLETE - 3+ VIEW COMPARISON:  None. FINDINGS: First MTP joint degenerative changes. Normal anatomic alignment. Midfoot degenerative changes. No aggressive or acute appearing osseous lesions. Regional soft tissues are unremarkable. IMPRESSION: No acute osseous abnormality. Electronically Signed   By: Lovey Newcomer M.D.   On: 08/11/2019 18:48    Procedures Procedures (including critical care time)  Medications Ordered in ED Medications  vancomycin (VANCOCIN) IVPB 1000 mg/200 mL premix (0 mg Intravenous Stopped 08/11/19 2014)  Tdap (BOOSTRIX) injection 0.5 mL (0.5 mLs Intramuscular Given 08/11/19 1914)     Initial Impression / Assessment and Plan / ED Course  I have reviewed the triage vital signs and the nursing notes.  Pertinent labs & imaging results that were available during my care of the patient were reviewed by me and considered in my medical decision making (see chart for details).        Patient is a 43 year old male who presents with redness to his right foot. His symptoms are consistent with cellulitis. There is no swelling or redness in the calf area and I do not feel likely that he has a DVT. He is mildly febrile. He has a mild elevation in his white count. His glucose is only mildly elevated. His lactate is normal. He otherwise looks well-appearing. He was given an IV dose of antibiotics and will be discharged on  clindamycin. His areas were marked on his foot and he was given strict return precautions. He was encouraged to have his PCP recheck his foot in the next day or two for improvement. His tetanus shot was updated.  Final Clinical Impressions(s) / ED Diagnoses   Final diagnoses:  Cellulitis of right lower extremity    ED Discharge Orders         Ordered    clindamycin (CLEOCIN) 300 MG capsule  4 times daily     08/11/19 2016           Malvin Johns, MD 08/11/19 2018

## 2019-08-11 NOTE — ED Triage Notes (Signed)
Patient reports a possible cellulitis to his right foot. Skin on the foot is erythematous and painful to the touch.  Patient reports he was recently in [place] for thanksgiving and walked around every few hours.

## 2019-08-12 ENCOUNTER — Telehealth: Payer: Self-pay | Admitting: Dietician

## 2019-08-12 NOTE — Telephone Encounter (Signed)
I spoke with patient via telephone to assess fluid intake and food tolerance since diet advancement to solid protein foods on 08/06/2019.  Surgery Date: 07/22/2019 Surgery Type: Sleeve  Daily fluid intake: 50+ ounces Daily protein intake: 80 grams  Patient states his transition to solid foods is going well. Having a little harder time meeting fluid goal, but overall doing good.  Foods include fish, beans, chicken, etc. No questions or concerns at this time.   Plan to follow up at [place] for diet advancement January 2021.    Nat Christen Olinda) Hajime Asfaw, MS, RD, LDN

## 2019-08-16 LAB — CULTURE, BLOOD (ROUTINE X 2)
Culture: NO GROWTH
Special Requests: ADEQUATE

## 2019-08-20 ENCOUNTER — Telehealth: Payer: Self-pay | Admitting: Skilled Nursing Facility1

## 2019-08-20 NOTE — Telephone Encounter (Signed)
Pluck out the corn and you're all good!  From: Raynald Blend [email or web address]>  Sent: Wednesday, August 14, 2019 3:55 PM To: Scotece, Alexis @[email or web address]> Subject: RE: Bariatric Vitamins  I have a soup question.   Ground beef, beans, little corn, diced canned tomatoes. Too early for that? Or try it out?  Mariel Sleet

## 2019-09-20 ENCOUNTER — Ambulatory Visit: Payer: BC Managed Care – PPO | Admitting: Dietician

## 2019-09-27 ENCOUNTER — Encounter: Payer: BC Managed Care – PPO | Attending: Surgery | Admitting: Dietician

## 2019-09-27 ENCOUNTER — Encounter: Payer: Self-pay | Admitting: Dietician

## 2019-09-27 ENCOUNTER — Other Ambulatory Visit: Payer: Self-pay

## 2019-09-27 DIAGNOSIS — E669 Obesity, unspecified: Secondary | ICD-10-CM | POA: Insufficient documentation

## 2019-09-27 DIAGNOSIS — E119 Type 2 diabetes mellitus without complications: Secondary | ICD-10-CM

## 2019-09-27 NOTE — Progress Notes (Signed)
Bariatric Nutrition Follow-Up Visit Medical Nutrition Therapy  Appt Start Time: 8:40am   End Time: 9:10am  2 Months Post-Operative Sleeve Gastrectomy Surgery Surgery Date: 07/21/2020  Pt's Expectations of Surgery/ Goals:  to act as a tool for better management of diabetes and achieving an active, better life; victories thus far include increased energy, less joint pain so able to walk more, wearing smaller sized clothing, fitting into furniture more comfortably, feeling accomplished by maintaining good nutrition   NUTRITION ASSESSMENT  Anthropometrics  Start weight at [facility]: 301.7 lbs (date: 10/30/2018)  Body Composition Scale 08/06/2019 09/26/2018  Weight  lbs 292.3 277.2  BMI 42 39.8  Total Body Fat  % - 34     Visceral Fat - 25  Fat-Free Mass  % - 65.9     Total Body Water  % - 46.9     Muscle-Mass  lbs - 52.3  Body Fat Displacement --- ---         Torso  lbs - 58.6         Left Leg  lbs - 11.7         Right Leg  lbs - 11.7         Left Arm  lbs - 5.8         Right Arm  lbs - 5.8    Lifestyle & Dietary Hx States blood sugar readings are typically around 120s. Overall accomplished at meeting fluid and protein goals.   24-Hr Dietary Recall First Meal: Premier Protein shake  Snack: Greek yogurt   Second Meal: chicken breast  Snack: -  Third Meal: salmon  Snack: sugar-free popsicles  Beverages: water, sugar-free flavored water, diet lemonade  Estimated daily fluid intake: 64+ oz Estimated daily protein intake: 80+ g Supplements: bariatric MVI, Tums 3x/day  Current average weekly physical activity: lots of walking (15,000+ steps/day)    Post-Op Goals/ Signs/ Symptoms Using straws: no Drinking while eating: no Chewing/swallowing difficulties: no Changes in vision: no Changes to mood/headaches: no Hair loss/changes to skin/nails: no Difficulty focusing/concentrating: no Sweating: no Dizziness/lightheadedness: no Palpitations: no  Carbonated/caffeinated beverages:  no N/V/D/C/Gas: some (improving)  Abdominal pain: no Dumping syndrome: no   NUTRITION DIAGNOSIS  Overweight/obesity ([place]-3.3) related to past poor dietary habits and physical inactivity as evidenced by completed bariatric surgery and following dietary guidelines for continued weight loss and healthy nutrition status.   NUTRITION INTERVENTION Nutrition counseling (C-1) and education (E-2) to facilitate bariatric surgery goals, including: . Diet advancement to the next phase (phase 4) now including non-starchy vegetables . The importance of consuming adequate calories as well as certain nutrients daily due to the body's need for essential vitamins, minerals, and fats . The importance of daily physical activity and to reach a goal of at least 150 minutes of moderate to vigorous physical activity weekly (or as directed by their physician) due to benefits such as increased musculature and improved lab values  Handouts Provided Include   Phase 4: Protein + Non-Starchy Vegetables   Learning Style & Readiness for Change Teaching method utilized: Visual & Auditory  Demonstrated degree of understanding via: Teach Back  Barriers to learning/adherence to lifestyle change: None Identified    MONITORING & EVALUATION Dietary intake, weekly physical activity, body weight, and goals in 3-4 months.  Next Steps Patient is to follow-up in 3-4 months for 6 month post-op follow-up.

## 2019-09-27 NOTE — Patient Instructions (Signed)

## 2019-11-11 ENCOUNTER — Telehealth: Payer: Self-pay | Admitting: Dietician

## 2019-11-11 NOTE — Telephone Encounter (Signed)
I returned patient's call to answer his questions about protein shakes. Patient was unavailable, LVM.

## 2019-11-28 DIAGNOSIS — E119 Type 2 diabetes mellitus without complications: Secondary | ICD-10-CM | POA: Diagnosis not present

## 2019-11-28 DIAGNOSIS — E782 Mixed hyperlipidemia: Secondary | ICD-10-CM | POA: Diagnosis not present

## 2019-11-28 DIAGNOSIS — F411 Generalized anxiety disorder: Secondary | ICD-10-CM | POA: Diagnosis not present

## 2019-12-24 ENCOUNTER — Ambulatory Visit: Payer: BC Managed Care – PPO | Admitting: Dietician

## 2020-01-27 IMAGING — DX DG CHEST 2V
2 series · 2 of 2 positions shown · non-contrast
Comparison: None.

CLINICAL DATA: Preop bariatric surgery

EXAM:
CHEST - 2 VIEW

[chest pa]
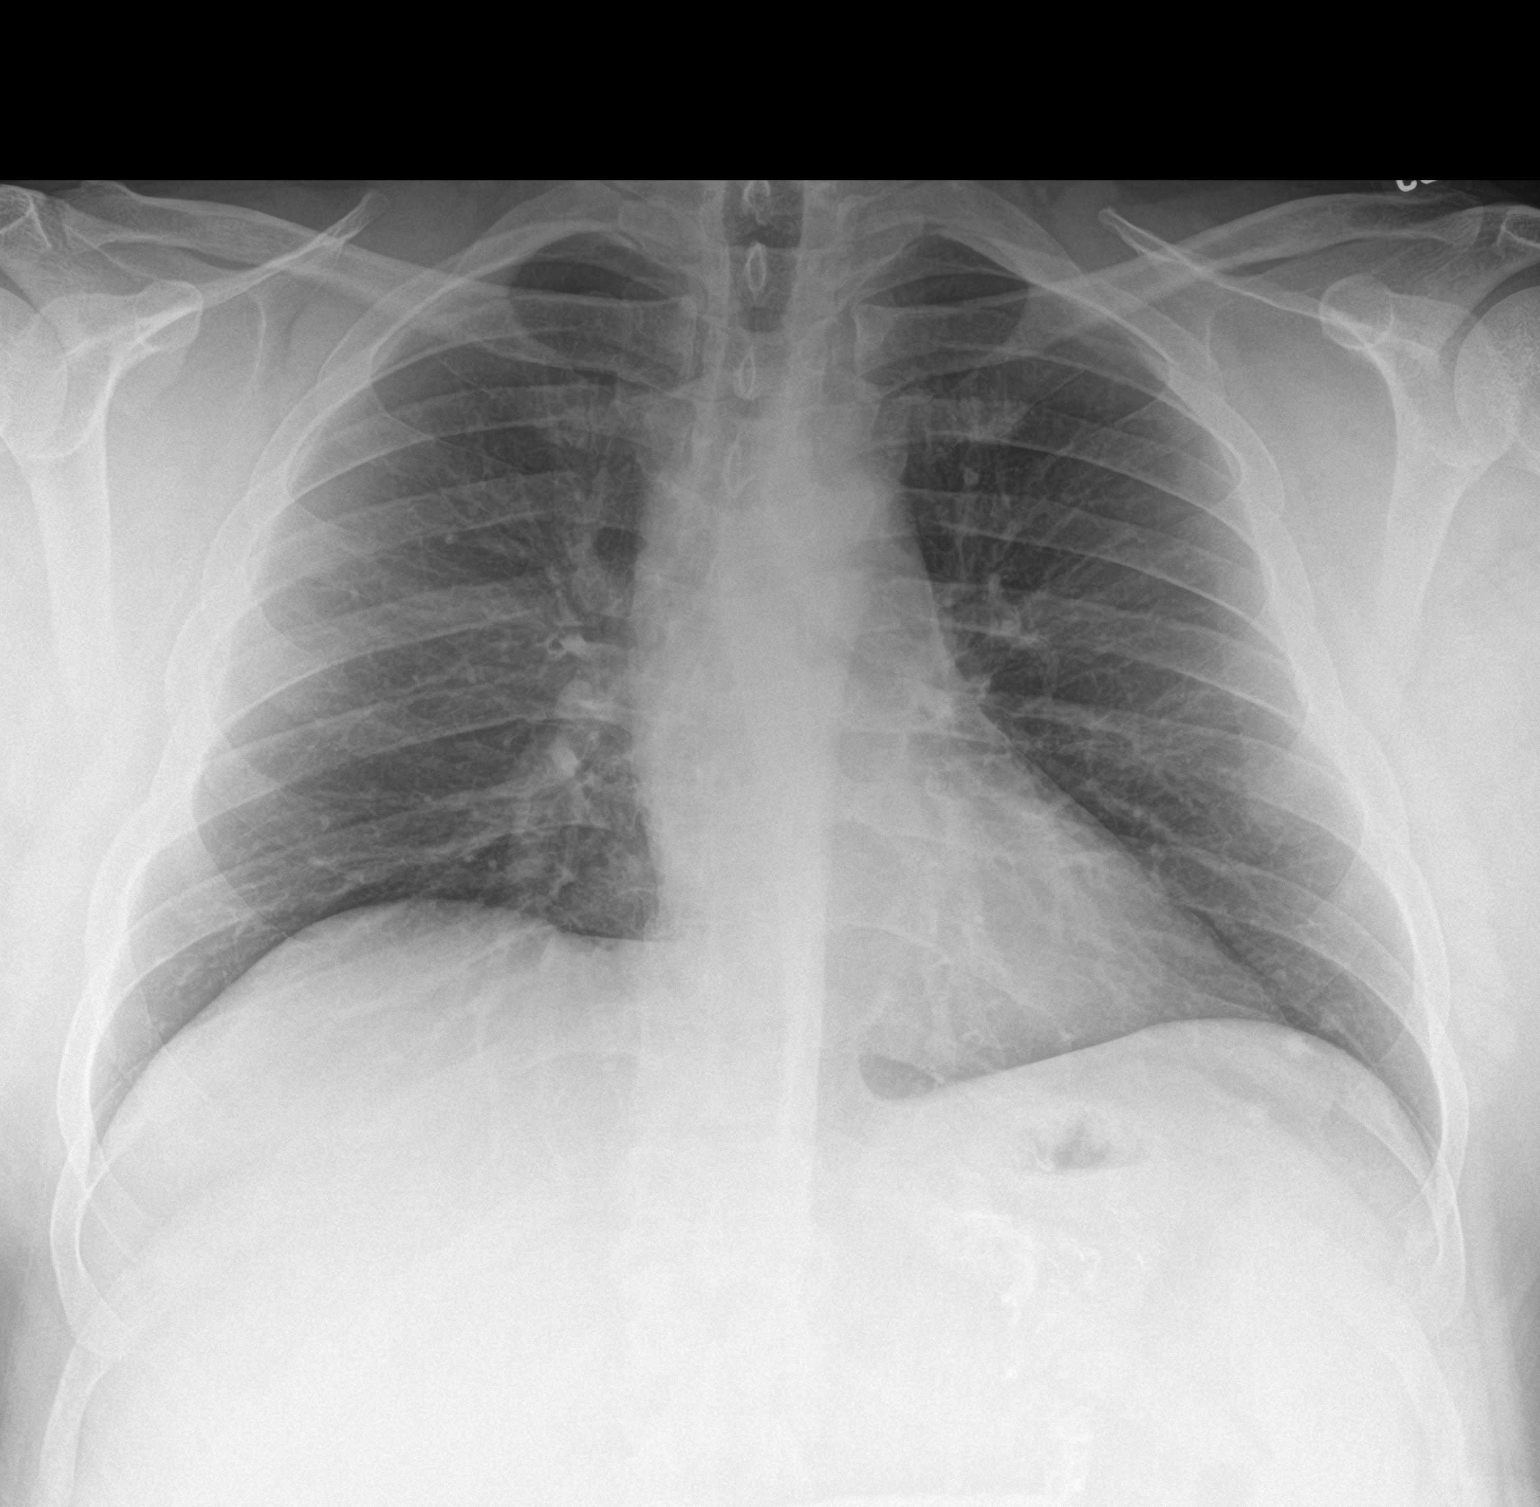

[chest lat]
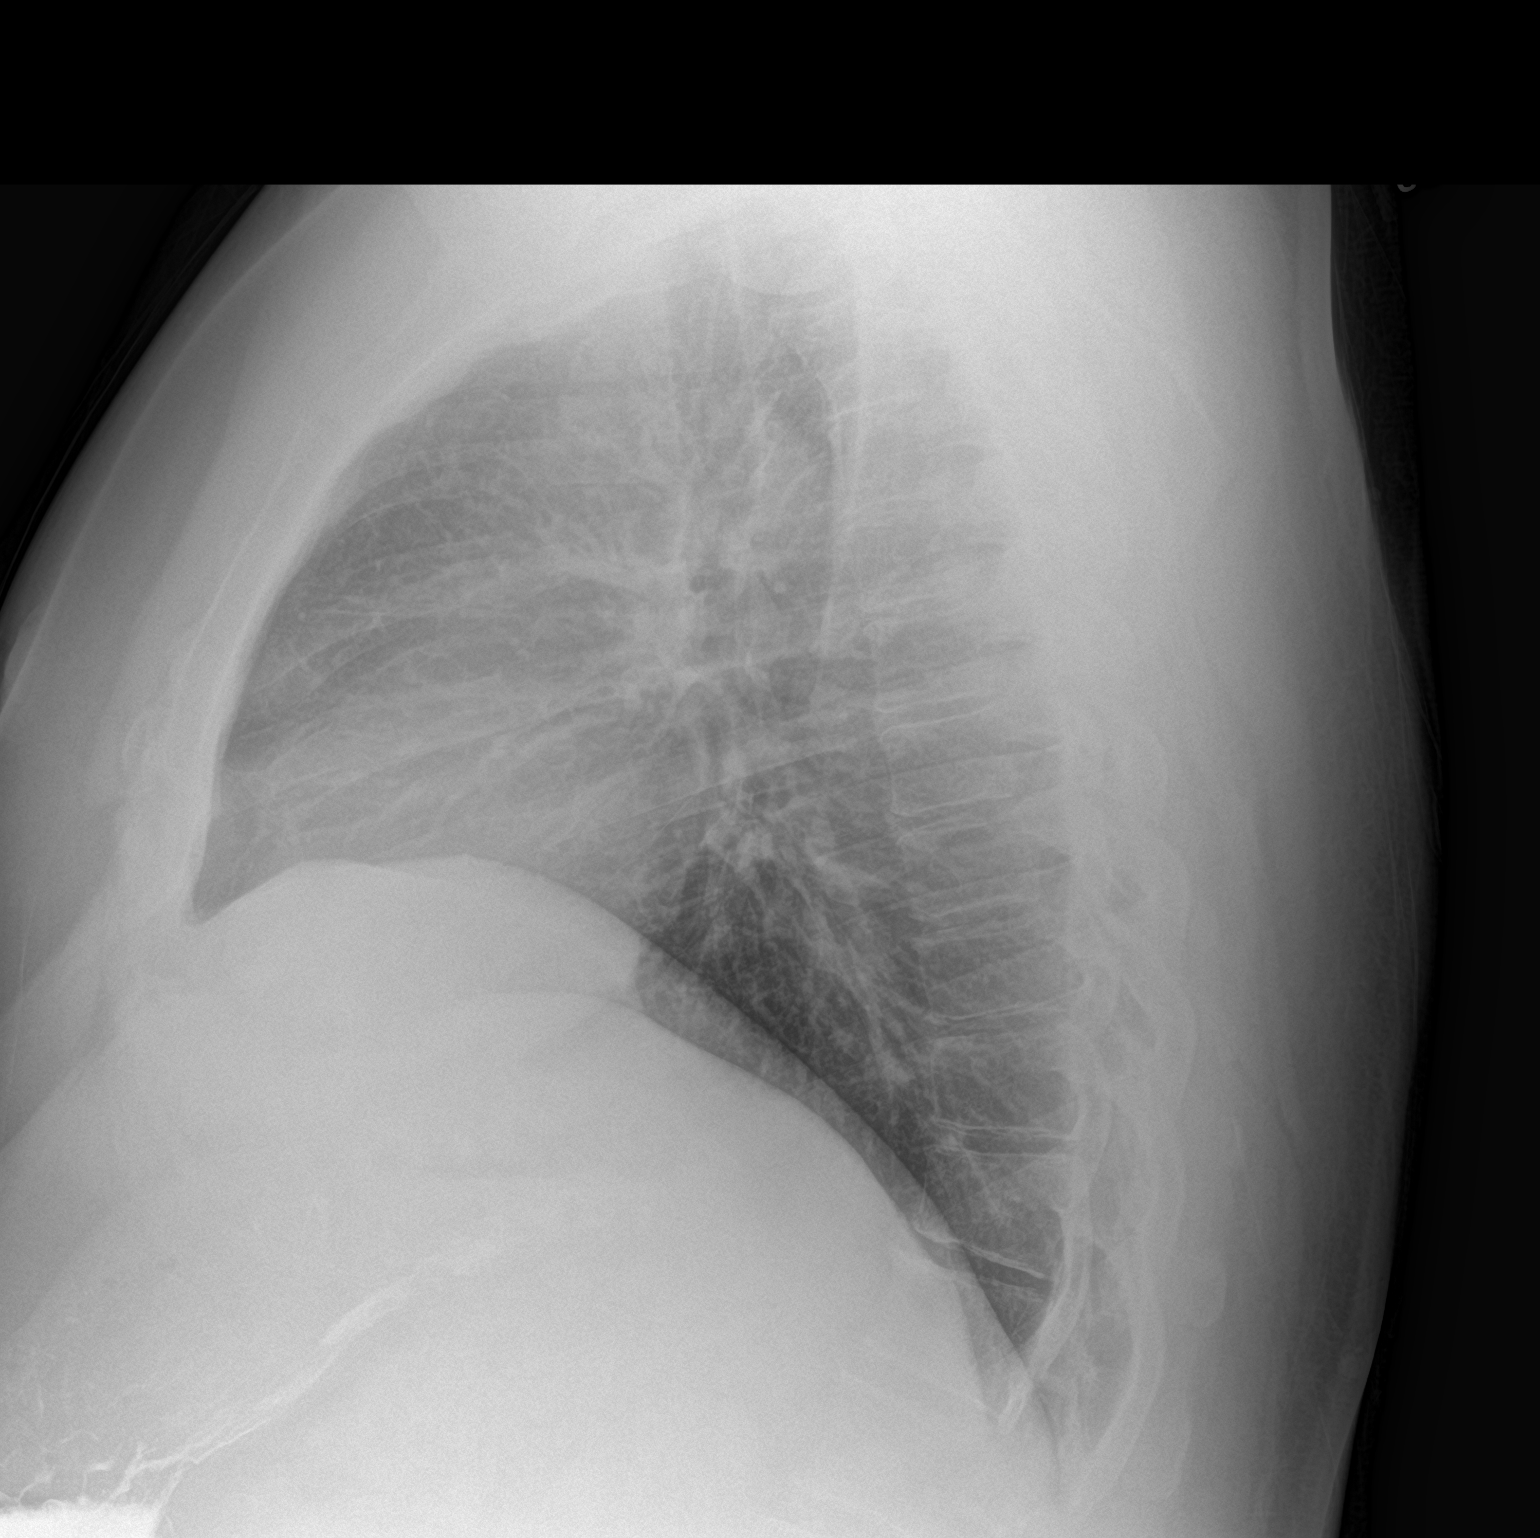

[2 of 2 positions shown; findings below may reference images not displayed]

FINDINGS: Lungs are clear.  No pleural effusion or pneumothorax.

The heart is normal in size.

Visualized osseous structures are within normal limits.
IMPRESSION: Normal chest radiographs.

## 2020-01-27 IMAGING — CR DG UGI W/O KUB
8 series · 12 of 24 positions shown · non-contrast
Comparison: None.

CLINICAL DATA: Preop gastric sleeve, reflux

EXAM:
UPPER GI SERIES WITHOUT KUB
TECHNIQUE: Routine upper GI series was performed with thin barium barium.
FLUOROSCOPY TIME:  Fluoroscopy Time:  1 minutes
Radiation Exposure Index (if provided by the fluoroscopic device):
72.4 mGy
Number of Acquired Spot Images: 7

[t abdomen supine (1 of 2)]
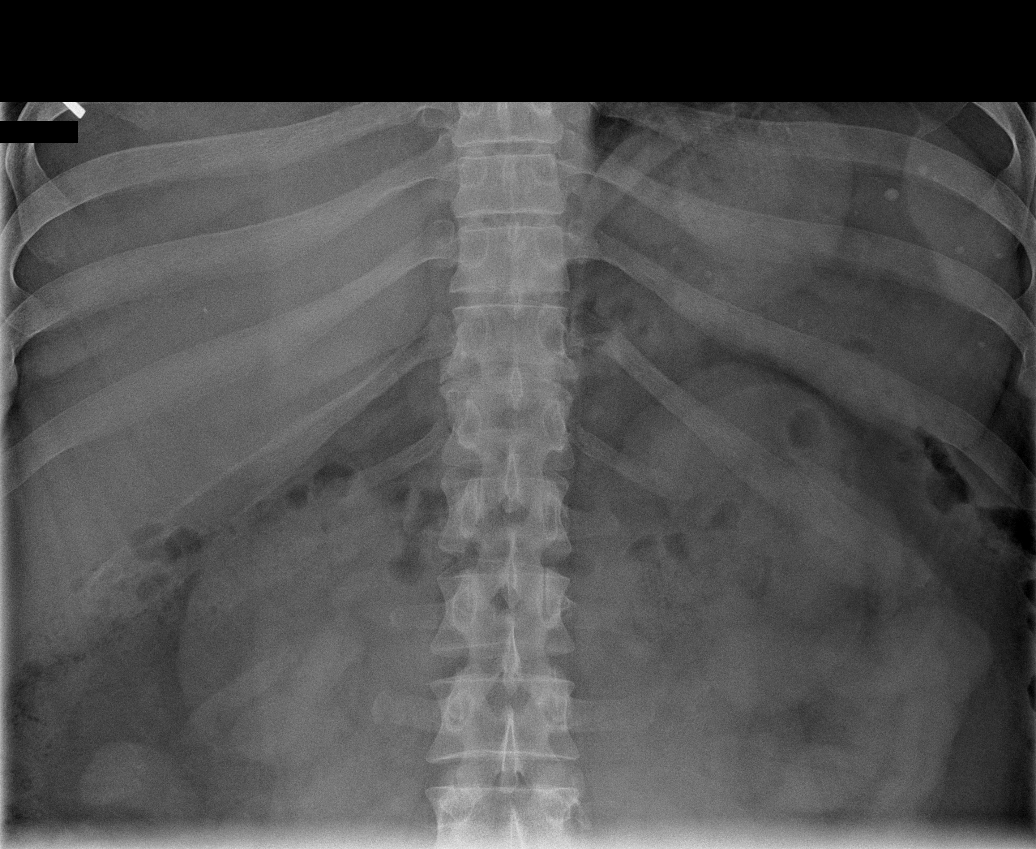

[t abdomen supine (2 of 2)]
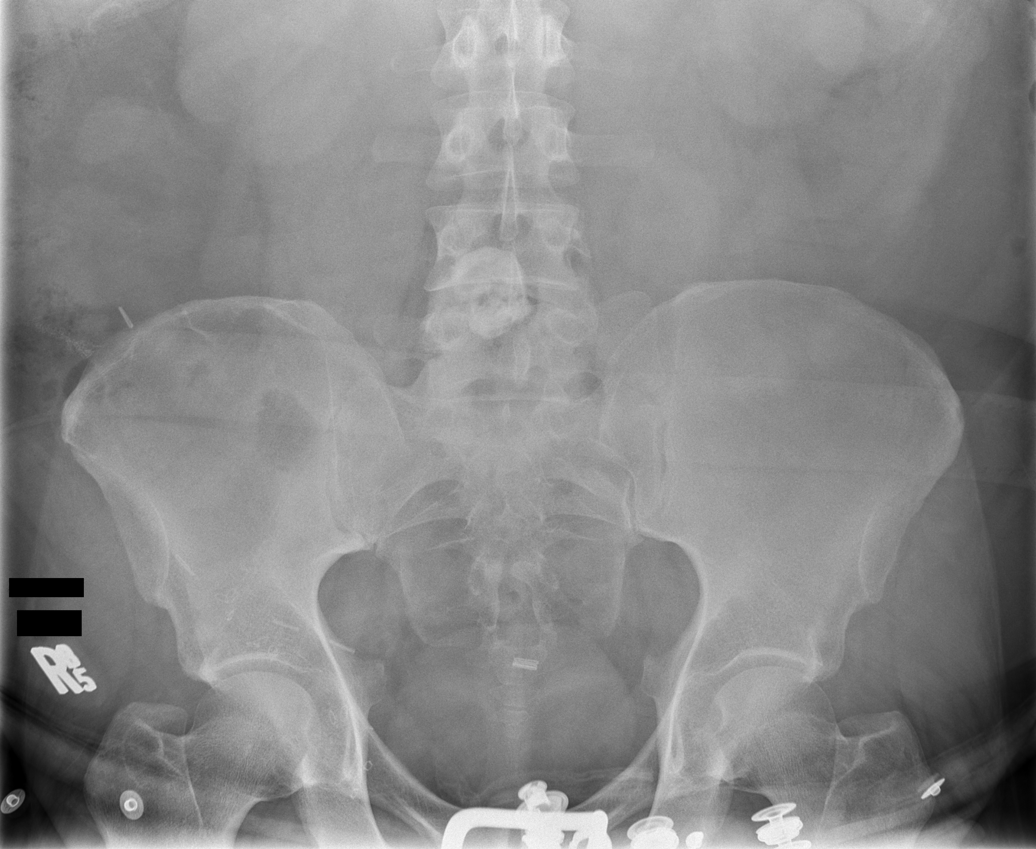

[Series 3: cp_standard · 0.52mm/px · 4 of 101 frames shown (1 of 6)]
[frame 16/101]
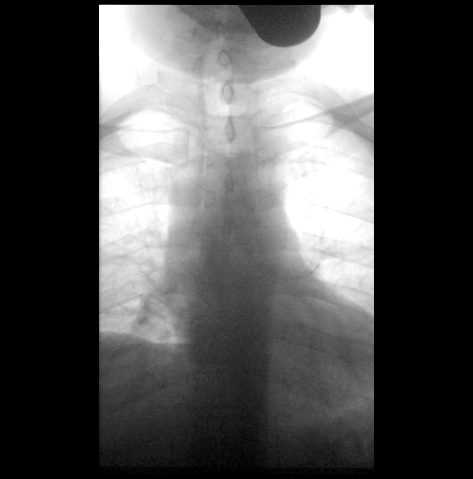
[frame 26/101]
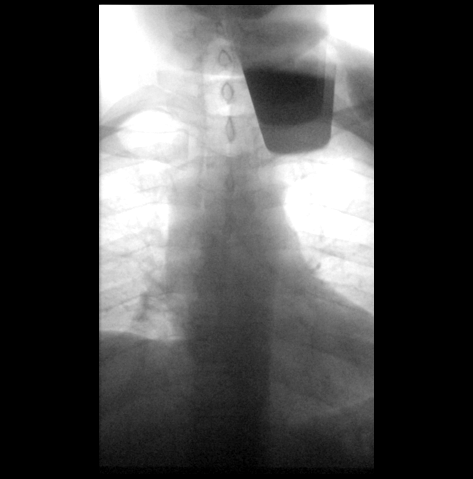
[frame 51/101]
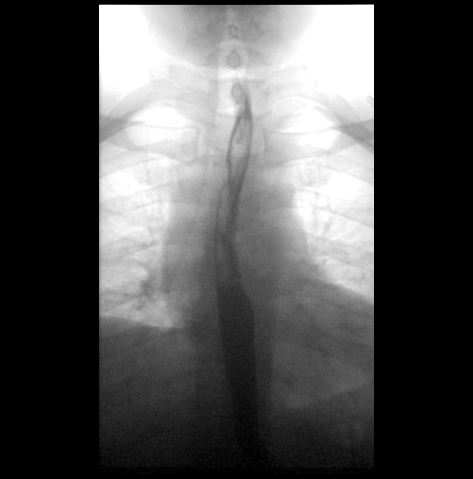
[frame 86/101]
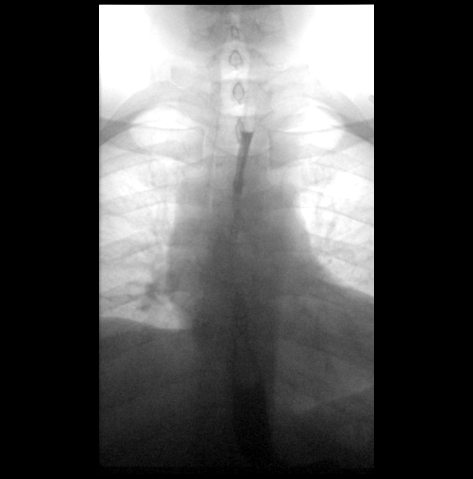

[cp_standard (2 of 6) · tomo slice 75/149.0]
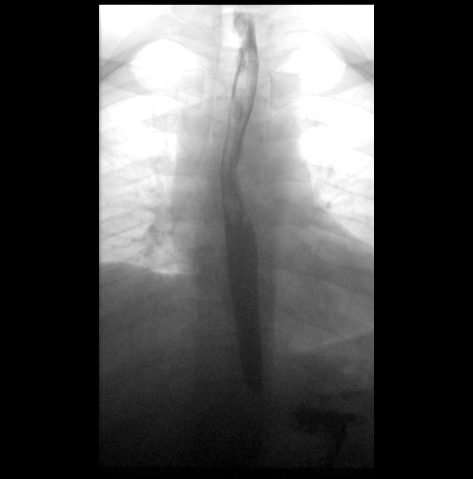

[cp_standard (3 of 6) · tomo slice 53/105.0]
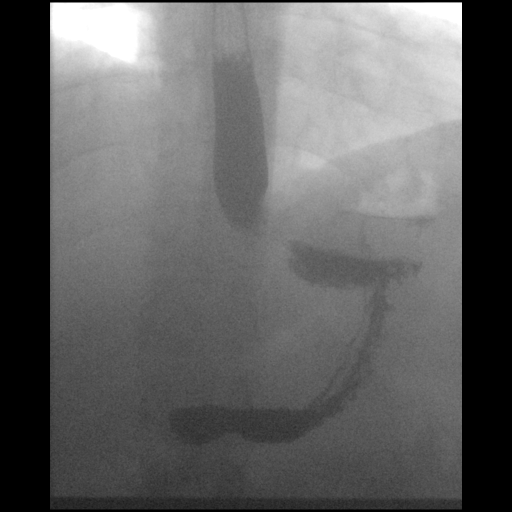

[Series 6: cp_standard · 0.54mm/px · 2 acquisitions, 2 frames shown (4 of 6)]
[im 1/2]
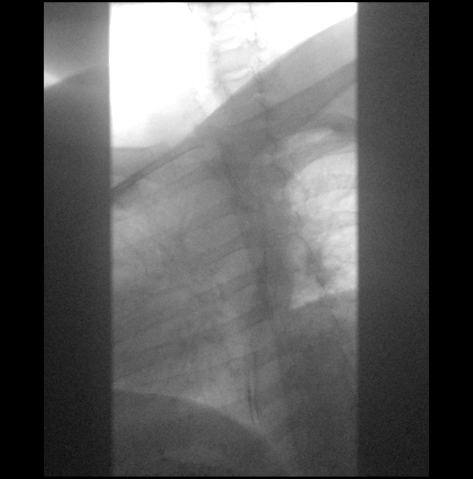
[im 2/2]
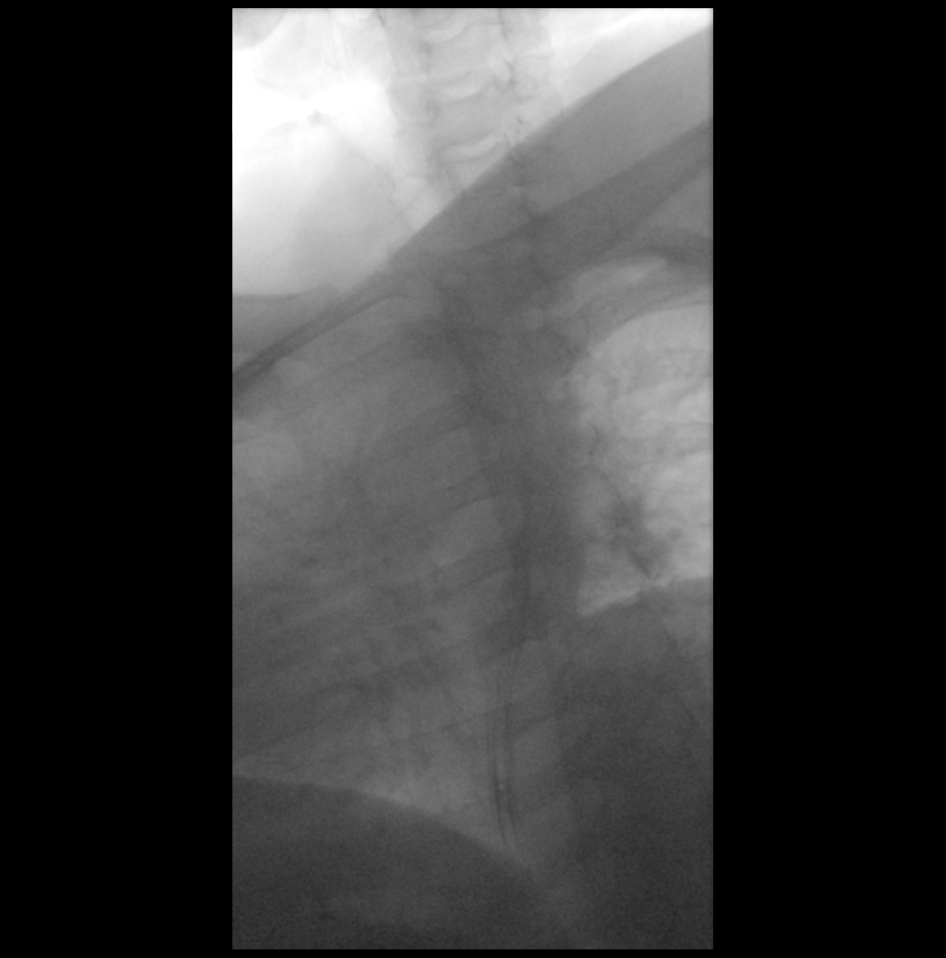

[cp_standard (5 of 6) · tomo slice 200/250.0]
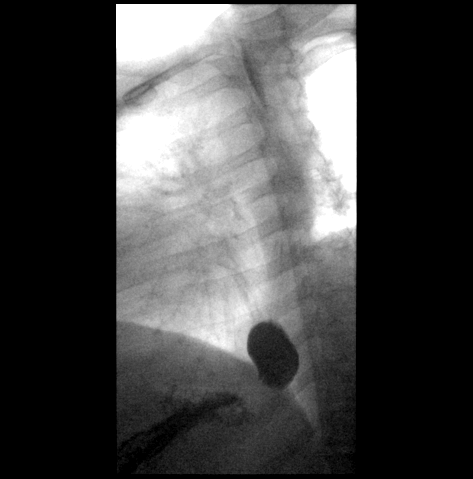

[cp_standard (6 of 6)]
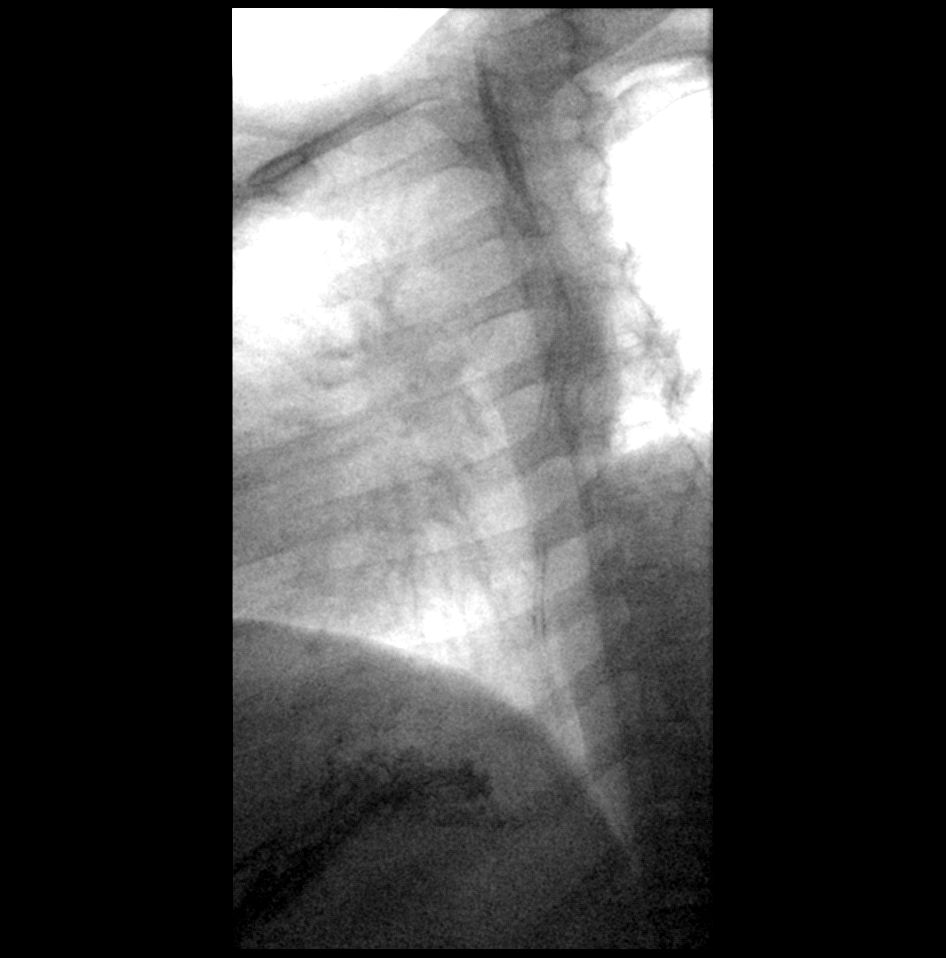

[12 of 24 positions shown; findings below may reference images not displayed]

FINDINGS: Normal esophageal peristalsis.

No fixed esophageal narrowing or stricture.

No evidence of hiatal hernia.

Normal gastric folds.

Mild gastroesophageal reflux.
IMPRESSION: Mild gastroesophageal reflux.

Otherwise negative single-contrast upper GI.

## 2020-02-13 DIAGNOSIS — R300 Dysuria: Secondary | ICD-10-CM | POA: Diagnosis not present

## 2020-02-13 DIAGNOSIS — Z6841 Body Mass Index (BMI) 40.0 and over, adult: Secondary | ICD-10-CM | POA: Diagnosis not present

## 2020-02-13 DIAGNOSIS — N419 Inflammatory disease of prostate, unspecified: Secondary | ICD-10-CM | POA: Diagnosis not present

## 2021-02-16 ENCOUNTER — Encounter (HOSPITAL_COMMUNITY): Payer: Self-pay | Admitting: *Deleted

## 2022-02-18 ENCOUNTER — Encounter (HOSPITAL_COMMUNITY): Payer: Self-pay | Admitting: *Deleted

## 2023-02-23 ENCOUNTER — Encounter (HOSPITAL_COMMUNITY): Payer: Self-pay | Admitting: *Deleted

## 2023-06-02 ENCOUNTER — Ambulatory Visit: Payer: BC Managed Care – PPO | Admitting: Urology

## 2023-06-07 ENCOUNTER — Ambulatory Visit: Payer: BC Managed Care – PPO | Admitting: Urology

## 2023-06-07 ENCOUNTER — Encounter: Payer: Self-pay | Admitting: Urology

## 2023-06-07 VITALS — BP 133/85 | HR 101

## 2023-06-07 DIAGNOSIS — N489 Disorder of penis, unspecified: Secondary | ICD-10-CM

## 2023-06-07 DIAGNOSIS — N486 Induration penis plastica: Secondary | ICD-10-CM | POA: Diagnosis not present

## 2023-06-07 LAB — URINALYSIS, ROUTINE W REFLEX MICROSCOPIC
Bilirubin, UA: NEGATIVE
Glucose, UA: NEGATIVE
Ketones, UA: NEGATIVE
Leukocytes,UA: NEGATIVE
Nitrite, UA: NEGATIVE
RBC, UA: NEGATIVE
Specific Gravity, UA: 1.025 (ref 1.005–1.030)
Urobilinogen, Ur: 1 mg/dL (ref 0.2–1.0)
pH, UA: 6 (ref 5.0–7.5)

## 2023-06-07 MED ORDER — PENTOXIFYLLINE ER 400 MG PO TBCR: 400.0000 mg | EXTENDED_RELEASE_TABLET | Freq: Two times a day (BID) | ORAL | 2 refills | Status: DC

## 2023-06-07 NOTE — Progress Notes (Signed)
06/07/2023 11:32 AM   Lorin Glass [birth year]?  [id number]  Referring provider: Lovey Newcomer, PA [address] [place],  [place] [address]  No chief complaint on file.   HPI: Mr Malik Ramos is a 47yo here for evaluation of penile curvature. Starting 2-3 months he noted dorsal curvature. He has intermittent pain with erection. 6 months he noted significant penile pain during intercourse. He can palpate a nodule on the dorsal aspect of his penis. No issues with erections. No issues urinating.    PMH: Past Medical History:  Diagnosis Date   Anxiety    Fatty liver    GERD (gastroesophageal reflux disease)    Hemorrhoids    History of kidney stones    Obesity    Peripheral neuropathy    Sleep apnea    does not have a CPAP machine   Type 2 diabetes mellitus (HCC)     Surgical History: Past Surgical History:  Procedure Laterality Date   ANAL FISSURE REPAIR     APPENDECTOMY     HERNIA REPAIR     LAPAROSCOPIC GASTRIC SLEEVE RESECTION N/A 07/23/2019   Procedure: LAPAROSCOPIC GASTRIC SLEEVE RESECTION, Upper Endo, ERAS Pathway;  Surgeon: Berna Bue, MD;  Location: [facility];  Service: General;  Laterality: N/A;    Home Medications:  Allergies as of 06/07/2023       Reactions   Codeine Nausea And Vomiting        Medication List        Accurate as of June 07, 2023 11:32 AM. If you have any questions, ask your nurse or doctor.          cetirizine 10 MG tablet Commonly known as: ZYRTEC Take 10 mg by mouth daily as needed for allergies.   clindamycin 300 MG capsule Commonly known as: CLEOCIN Take 1 capsule (300 mg total) by mouth 4 (four) times daily. X 7 days   gabapentin 100 MG capsule Commonly known as: NEURONTIN Take 2 capsules (200 mg total) by mouth every 12 (twelve) hours.   metFORMIN 500 MG tablet Commonly known as: Glucophage Take 1 tablet (500 mg total) by mouth 4 (four) times daily - after meals and at bedtime. Check blood sugar frequently  as you may need to change this medication quickly after surgery. Follow up with your primary care doctor regarding titration of this medication.   ondansetron 4 MG disintegrating tablet Commonly known as: ZOFRAN-ODT Take 1 tablet (4 mg total) by mouth every 6 (six) hours as needed for nausea or vomiting.   pantoprazole 40 MG tablet Commonly known as: PROTONIX Take 1 tablet (40 mg total) by mouth daily.   PARoxetine 40 MG tablet Commonly known as: PAXIL Take 40 mg by mouth at bedtime.   traMADol 50 MG tablet Commonly known as: ULTRAM Take 1 tablet (50 mg total) by mouth every 6 (six) hours as needed (pain).        Allergies:  Allergies  Allergen Reactions   Codeine Nausea And Vomiting    Family History: Family History  Problem Relation Age of Onset   Hypertension Mother     Social History:  reports that he has never smoked. He has never used smokeless tobacco. He reports current alcohol use. He reports that he does not use drugs.  ROS: All other review of systems were reviewed and are negative except what is noted above in HPI  Physical Exam: BP 133/85   Pulse (!) 101   Constitutional:  Alert and oriented,  No acute distress. HEENT: [place] AT, moist mucus membranes.  Trachea midline, no masses. Cardiovascular: No clubbing, cyanosis, or edema. Respiratory: Normal respiratory effort, no increased work of breathing. GI: Abdomen is soft, nontender, nondistended, no abdominal masses GU: No CVA tenderness. Circumcised phallus. No masses/lesions on penis, testis, scrotum. 2cm palpable dorsal peyronies plaque.   Lymph: No cervical or inguinal lymphadenopathy. Skin: No rashes, bruises or suspicious lesions. Neurologic: Grossly intact, no focal deficits, moving all 4 extremities. Psychiatric: Normal mood and affect.  Laboratory Data: Lab Results  Component Value Date   WBC 11.1 (H) 08/11/2019   HGB 14.4 08/11/2019   HCT 45.2 08/11/2019   MCV 89.3 08/11/2019   PLT 227  08/11/2019    Lab Results  Component Value Date   CREATININE 0.83 08/11/2019    No results found for: "PSA"  No results found for: "TESTOSTERONE"  Lab Results  Component Value Date   HGBA1C 8.6 (H) 07/19/2019    Urinalysis No results found for: "COLORURINE", "APPEARANCEUR", "LABSPEC", "PHURINE", "GLUCOSEU", "HGBUR", "BILIRUBINUR", "KETONESUR", "PROTEINUR", "UROBILINOGEN", "NITRITE", "LEUKOCYTESUR"  No results found for: "LABMICR", "WBCUA", "RBCUA", "LABEPIT", "MUCUS", "BACTERIA"  Pertinent Imaging:  No results found for this or any previous visit.  No results found for this or any previous visit.  No results found for this or any previous visit.  No results found for this or any previous visit.  No results found for this or any previous visit.  No valid procedures specified. No results found for this or any previous visit.  No results found for this or any previous visit.   Assessment & Plan:    1. Peyronies disease -We discussed the management of peyronies disease including medical therapy, penile plication, verapamil therapy and xiaflex therapy. After discussed the options the patient elects for medical therapy since he is in the active phase of peyronies disease. I will see him back in 3 months. We will trial trental 400mg  BID for 3 months  - Urinalysis, Routine w reflex microscopic   No follow-ups on file.  Wilkie Aye, MD  [facility] [place]

## 2023-06-07 NOTE — Patient Instructions (Signed)
Collagenase Injection (Dupuytren Contracture/Peyronie Disease) What is this medication? COLLAGENASE (kohl LAH jen ace) treats conditions caused by thickening of tissue in your body. It works by breaking down excess collagen in the tissue, which reduces stiffness and tightness. This medicine may be used for other purposes; ask your health care provider or pharmacist if you have questions. COMMON BRAND NAME(S): Xiaflex What should I tell my care team before I take this medication? They need to know if you have any of these conditions: Bleeding disorder An unusual or allergic reaction to collagenase, other medications, foods, dyes, or preservatives Pregnant or trying to get pregnant Breast-feeding How should I use this medication? This medication is injected into the affected area. It is given by your care team in a hospital or clinic setting. A special MedGuide will be given to you by the pharmacist with each prescription and refill. Be sure to read this information carefully each time. Talk to your care team about the use of this medication in children. Special care may be needed. Overdosage: If you think you have taken too much of this medicine contact a poison control center or emergency room at once. NOTE: This medicine is only for you. Do not share this medicine with others. What if I miss a dose? Keep appointments for follow-up doses. It is important not to miss your dose. Call your care team if you are unable to keep an appointment. What may interact with this medication? Aspirin and aspirin-like medications Certain medications that treat or prevent blood clots, such as warfarin, enoxaparin, dalteparin, apixaban, dabigatran, rivaroxaban This list may not describe all possible interactions. Give your health care provider a list of all the medicines, herbs, non-prescription drugs, or dietary supplements you use. Also tell them if you smoke, drink alcohol, or use illegal drugs. Some items may  interact with your medicine. What should I watch for while using this medication? Your condition will be monitored carefully while you are receiving this medication. If medication is for Dupuytren's Contracture, visit your care team 1 to 3 days after the injection. Until you visit your care team, do not flex or extend the fingers of your hand that was injected. Do not touch your finger that was injected. Elevate your hand until bedtime. Do not perform activity with the injected hand until you are told that it is ok. Follow any instructions about wearing a splint or performing finger exercises. Contact your care team as soon as possible if you get increasing redness or swelling in the hand, have numbness or tingling in the treated finger, or have trouble bending the finger after the swelling goes down. If medication is for Peyronie's disease, do not have sex between the first and second injections. Wait 4 weeks after the second injection and when there is no more pain or swelling in the penis to have sex. Avoid using vacuum erection devices during treatment with this medication. Try to avoid straining stomach muscles such as during bowel movements. Your care team will give you instructions on how to perform modeling activities at home. Contact your care team as soon as possible if you have severe pain or swelling in the penis, severe purple bruising and swelling of the penis, trouble passing urine, blood in urine, popping or cracking sound form the penis, or sudden loss of ability to maintain an erection. What side effects may I notice from receiving this medication? Side effects that you should report to your care team as soon as possible: Allergic reactions--skin rash,   itching, hives, swelling of the face, lips, tongue, or throat Feeling faint or lightheaded Skin infection--skin redness, swelling, warmth, or pain Severe back pain, chest pain, headache, trouble breathing after injection Snap or pop that  you feel or hear, severe pain, numbness, swelling, or bruising of or trouble moving in area where injected Side effects that usually do not require medical attention (report to your care team if they continue or are bothersome): Pain, redness, or irritation at injection site This list may not describe all possible side effects. Call your doctor for medical advice about side effects. You may report side effects to FDA at 1-800-FDA-1088. Where should I keep my medication? This medication is given in a hospital or clinic. It will not be stored at home. NOTE: This sheet is a summary. It may not cover all possible information. If you have questions about this medicine, talk to your doctor, pharmacist, or health care provider.  2024 Elsevier/Gold Standard (2021-08-13 00:00:00)  

## 2023-06-23 ENCOUNTER — Other Ambulatory Visit: Payer: Self-pay

## 2023-06-23 MED ORDER — ONDANSETRON HCL 4 MG PO TABS: 4.0000 mg | ORAL_TABLET | Freq: Three times a day (TID) | ORAL | 0 refills | Status: AC | PRN

## 2023-08-17 ENCOUNTER — Other Ambulatory Visit: Payer: Self-pay | Admitting: Urology

## 2023-09-20 ENCOUNTER — Ambulatory Visit: Payer: BC Managed Care – PPO | Admitting: Urology

## 2023-09-20 VITALS — BP 145/91 | HR 98

## 2023-09-20 DIAGNOSIS — N486 Induration penis plastica: Secondary | ICD-10-CM

## 2023-09-20 NOTE — Patient Instructions (Addendum)
 PLEASE BRING PROSTAGLANDIN TO 11/06/23 APPOINTMENT. YOU CAN PICK THIS UP AT [facility] ON [address] [place]. PLEASE GIVE THEM 2 DAY ADVANCE NOTICE TO MIX MEDICATION.

## 2023-09-20 NOTE — Progress Notes (Signed)
 09/20/2023 11:09 AM   Fonda LITTIE Dines 04-Feb-1976 [id number]  Referring provider: Jolee Elsie RAMAN, PA [address] [place],  [place] [address]  Followup peyronies disease   HPI: Mr Malik Ramos is a 48yo here for followup for peyronies disease. NO worsening curvature since last visit. He has pain with intercourse. He has not noticed change in firmness in the plaque. No issues with erectile dysfunction.  He is unsure with proceeding with either xiaflex  or plication. Based on pictures provided by the patient he has 46 degree dorsal curvature at the mid to distal penile shaft   PMH: Past Medical History:  Diagnosis Date   Anxiety    Fatty liver    GERD (gastroesophageal reflux disease)    Hemorrhoids    History of kidney stones    Obesity    Peripheral neuropathy    Sleep apnea    does not have a CPAP machine   Type 2 diabetes mellitus (HCC)     Surgical History: Past Surgical History:  Procedure Laterality Date   ANAL FISSURE REPAIR     APPENDECTOMY     HERNIA REPAIR     LAPAROSCOPIC GASTRIC SLEEVE RESECTION N/A 07/23/2019   Procedure: LAPAROSCOPIC GASTRIC SLEEVE RESECTION, Upper Endo, ERAS Pathway;  Surgeon: Signe Mitzie LABOR, MD;  Location: [facility];  Service: General;  Laterality: N/A;    Home Medications:  Allergies as of 09/20/2023       Reactions   Codeine Nausea And Vomiting        Medication List        Accurate as of September 20, 2023 11:09 AM. If you have any questions, ask your nurse or doctor.          cetirizine 10 MG tablet Commonly known as: ZYRTEC Take 10 mg by mouth daily as needed for allergies.   clindamycin  300 MG capsule Commonly known as: CLEOCIN  Take 1 capsule (300 mg total) by mouth 4 (four) times daily. X 7 days   gabapentin  100 MG capsule Commonly known as: NEURONTIN  Take 2 capsules (200 mg total) by mouth every 12 (twelve) hours.   metFORMIN  500 MG tablet Commonly known as: Glucophage  Take 1 tablet (500 mg total) by mouth 4  (four) times daily - after meals and at bedtime. Check blood sugar frequently as you may need to change this medication quickly after surgery. Follow up with your primary care doctor regarding titration of this medication.   ondansetron  4 MG disintegrating tablet Commonly known as: ZOFRAN -ODT Take 1 tablet (4 mg total) by mouth every 6 (six) hours as needed for nausea or vomiting.   ondansetron  4 MG tablet Commonly known as: Zofran  Take 1 tablet (4 mg total) by mouth every 8 (eight) hours as needed for nausea or vomiting.   pantoprazole  40 MG tablet Commonly known as: PROTONIX  Take 1 tablet (40 mg total) by mouth daily.   PARoxetine  40 MG tablet Commonly known as: PAXIL  Take 40 mg by mouth at bedtime.   pentoxifylline  400 MG CR tablet Commonly known as: TRENTAL  TAKE 1 TABLET BY MOUTH IN THE MORNING AND AT BEDTIME   traMADol  50 MG tablet Commonly known as: ULTRAM  Take 1 tablet (50 mg total) by mouth every 6 (six) hours as needed (pain).        Allergies:  Allergies  Allergen Reactions   Codeine Nausea And Vomiting    Family History: Family History  Problem Relation Age of Onset   Hypertension Mother     Social  History:  reports that he has never smoked. He has never used smokeless tobacco. He reports current alcohol use. He reports that he does not use drugs.  ROS: All other review of systems were reviewed and are negative except what is noted above in HPI  Physical Exam: There were no vitals taken for this visit.  Constitutional:  Alert and oriented, No acute distress. HEENT: [place] AT, moist mucus membranes.  Trachea midline, no masses. Cardiovascular: No clubbing, cyanosis, or edema. Respiratory: Normal respiratory effort, no increased work of breathing. GI: Abdomen is soft, nontender, nondistended, no abdominal masses GU: No CVA tenderness.  Lymph: No cervical or inguinal lymphadenopathy. Skin: No rashes, bruises or suspicious lesions. Neurologic: Grossly  intact, no focal deficits, moving all 4 extremities. Psychiatric: Normal mood and affect.  Laboratory Data: Lab Results  Component Value Date   WBC 11.1 (H) 08/11/2019   HGB 14.4 08/11/2019   HCT 45.2 08/11/2019   MCV 89.3 08/11/2019   PLT 227 08/11/2019    Lab Results  Component Value Date   CREATININE 0.83 08/11/2019    No results found for: PSA  No results found for: TESTOSTERONE  Lab Results  Component Value Date   HGBA1C 8.6 (H) 07/19/2019    Urinalysis    Component Value Date/Time   APPEARANCEUR Clear 06/07/2023 1136   GLUCOSEU Negative 06/07/2023 1136   BILIRUBINUR Negative 06/07/2023 1136   PROTEINUR Trace 06/07/2023 1136   NITRITE Negative 06/07/2023 1136   LEUKOCYTESUR Negative 06/07/2023 1136    Lab Results  Component Value Date   LABMICR Comment 06/07/2023    Pertinent Imaging:  No results found for this or any previous visit.  No results found for this or any previous visit.  No results found for this or any previous visit.  No results found for this or any previous visit.  No results found for this or any previous visit.  No results found for this or any previous visit.  No results found for this or any previous visit.  No results found for this or any previous visit.   Assessment & Plan:    1. Peyronie disease (Primary) We discussed the management of peyronies disease including medical therapy, penile plication, verapamil therapy and xiaflex  therapy. After discussed the options the patient elects for xiaflex  therapy   No follow-ups on file.  Belvie Clara, MD  [facility] [place]

## 2023-09-21 MED ORDER — AMBULATORY NON FORMULARY MEDICATION: 3 refills | Status: AC

## 2023-09-22 ENCOUNTER — Encounter: Payer: Self-pay | Admitting: Urology

## 2023-11-06 ENCOUNTER — Ambulatory Visit: Payer: BC Managed Care – PPO | Admitting: Urology

## 2023-11-08 ENCOUNTER — Ambulatory Visit: Payer: BC Managed Care – PPO | Admitting: Urology

## 2023-11-10 ENCOUNTER — Ambulatory Visit: Payer: BC Managed Care – PPO | Admitting: Urology

## 2023-11-20 ENCOUNTER — Ambulatory Visit (INDEPENDENT_AMBULATORY_CARE_PROVIDER_SITE_OTHER): Payer: BC Managed Care – PPO | Admitting: Urology

## 2023-11-20 VITALS — BP 144/74 | HR 73

## 2023-11-20 DIAGNOSIS — N486 Induration penis plastica: Secondary | ICD-10-CM | POA: Diagnosis not present

## 2023-11-20 MED ORDER — COLLAGENASE CLOSTRID HISTOLYT 0.9 MG IJ SOLR
0.9000 mg | Freq: Once | INTRAMUSCULAR | Status: AC
  Administered 2023-11-20: 0.9 mg via INTRAMUSCULAR

## 2023-11-20 MED ORDER — PHENYLEPHRINE 100 MCG/ML FOR PRIAPISM (OUTPATIENT ~~LOC~~ UROLOGY USE ONLY): 100.0000 ug | Freq: Once | INTRAMUSCULAR | Status: DC

## 2023-11-20 MED ORDER — PHENYLEPHRINE 100 MCG/ML FOR PRIAPISM (OUTPATIENT ~~LOC~~ UROLOGY USE ONLY)
100.0000 ug | Freq: Once | INTRAMUSCULAR | Status: AC
  Administered 2023-11-20: 100 ug via INTRACAVERNOUS

## 2023-11-20 NOTE — Progress Notes (Unsigned)
 Xiaflex injection:  Procedure:  Concentration and amount of PGE-1 injected: 0.5cc of 10g/CC  Approximate size and location of plaque: 2cm dorsal mid to distal penile shaft   Degree of curvature: 75 degree dorsal  Neo-Synephrine 150 g brought brisk detumescence.  Injection procedure:  Using sterile technique an alcohol swab was used to clean the area of injection. The location had been previously marked. A 27-gauge needle was inserted through the skin and into the plaque at the point of maximum concavity with the needle oriented perpendicular to the corpus cavernosum. The needle was advanced transversely through the width of the plaque toward the opposite side of the plaque without passing completely through it. Proper position was confirmed by noting resistance to minimal depression of the syringe plunger. I then began injecting maintaining steady pressure as I withdrew the needle slowly through the transverse width of the plaque depositing the 80mg  of Xiaflex solution 10mg  wastage. The needle was then withdrawn and gentle pressure was applied at the injection site.    He tolerated his injection well. I will therefore have him return in 48 hours for repeat injection.

## 2023-11-21 ENCOUNTER — Encounter: Payer: Self-pay | Admitting: Urology

## 2023-11-21 NOTE — Patient Instructions (Signed)
 Collagenase Injection (Dupuytren Contracture/Peyronie Disease) What is this medication? COLLAGENASE (kohl LAH jen ace) treats conditions caused by thickening of tissue in your body. It works by breaking down excess collagen in the tissue, which reduces stiffness and tightness. This medicine may be used for other purposes; ask your health care provider or pharmacist if you have questions. COMMON BRAND NAME(S): Xiaflex What should I tell my care team before I take this medication? They need to know if you have any of these conditions: Bleeding disorder An unusual or allergic reaction to collagenase, other medications, foods, dyes, or preservatives Pregnant or trying to get pregnant Breast-feeding How should I use this medication? This medication is injected into the affected area. It is given by your care team in a hospital or clinic setting. A special MedGuide will be given to you by the pharmacist with each prescription and refill. Be sure to read this information carefully each time. Talk to your care team about the use of this medication in children. Special care may be needed. Overdosage: If you think you have taken too much of this medicine contact a poison control center or emergency room at once. NOTE: This medicine is only for you. Do not share this medicine with others. What if I miss a dose? Keep appointments for follow-up doses. It is important not to miss your dose. Call your care team if you are unable to keep an appointment. What may interact with this medication? Aspirin and aspirin-like medications Certain medications that treat or prevent blood clots, such as warfarin, enoxaparin, dalteparin, apixaban, dabigatran, rivaroxaban This list may not describe all possible interactions. Give your health care provider a list of all the medicines, herbs, non-prescription drugs, or dietary supplements you use. Also tell them if you smoke, drink alcohol, or use illegal drugs. Some items may  interact with your medicine. What should I watch for while using this medication? Your condition will be monitored carefully while you are receiving this medication. If medication is for Dupuytren's Contracture, visit your care team 1 to 3 days after the injection. Until you visit your care team, do not flex or extend the fingers of your hand that was injected. Do not touch your finger that was injected. Elevate your hand until bedtime. Do not perform activity with the injected hand until you are told that it is ok. Follow any instructions about wearing a splint or performing finger exercises. Contact your care team as soon as possible if you get increasing redness or swelling in the hand, have numbness or tingling in the treated finger, or have trouble bending the finger after the swelling goes down. If medication is for Peyronie's disease, do not have sex between the first and second injections. Wait 4 weeks after the second injection and when there is no more pain or swelling in the penis to have sex. Avoid using vacuum erection devices during treatment with this medication. Try to avoid straining stomach muscles such as during bowel movements. Your care team will give you instructions on how to perform modeling activities at home. Contact your care team as soon as possible if you have severe pain or swelling in the penis, severe purple bruising and swelling of the penis, trouble passing urine, blood in urine, popping or cracking sound form the penis, or sudden loss of ability to maintain an erection. What side effects may I notice from receiving this medication? Side effects that you should report to your care team as soon as possible: Allergic reactions--skin rash,  itching, hives, swelling of the face, lips, tongue, or throat Feeling faint or lightheaded Skin infection--skin redness, swelling, warmth, or pain Severe back pain, chest pain, headache, trouble breathing after injection Snap or pop that  you feel or hear, severe pain, numbness, swelling, or bruising of or trouble moving in area where injected Side effects that usually do not require medical attention (report to your care team if they continue or are bothersome): Pain, redness, or irritation at injection site This list may not describe all possible side effects. Call your doctor for medical advice about side effects. You may report side effects to FDA at 1-800-FDA-1088. Where should I keep my medication? This medication is given in a hospital or clinic. It will not be stored at home. NOTE: This sheet is a summary. It may not cover all possible information. If you have questions about this medicine, talk to your doctor, pharmacist, or health care provider.  2024 Elsevier/Gold Standard (2021-08-13 00:00:00)

## 2023-11-22 ENCOUNTER — Ambulatory Visit (INDEPENDENT_AMBULATORY_CARE_PROVIDER_SITE_OTHER): Payer: BC Managed Care – PPO | Admitting: Urology

## 2023-11-22 VITALS — BP 128/80 | HR 103

## 2023-11-22 DIAGNOSIS — N486 Induration penis plastica: Secondary | ICD-10-CM

## 2023-11-22 MED ORDER — COLLAGENASE CLOSTRID HISTOLYT 0.9 MG IJ SOLR
0.9000 mg | Freq: Once | INTRAMUSCULAR | Status: AC
  Administered 2023-11-22: 0.9 mg via INTRAMUSCULAR

## 2023-11-24 ENCOUNTER — Ambulatory Visit: Payer: BC Managed Care – PPO | Admitting: Urology

## 2023-11-28 ENCOUNTER — Encounter: Payer: Self-pay | Admitting: Urology

## 2023-11-28 NOTE — Patient Instructions (Signed)
 Collagenase Injection (Dupuytren Contracture/Peyronie Disease) What is this medication? COLLAGENASE (kohl LAH jen ace) treats conditions caused by thickening of tissue in your body. It works by breaking down excess collagen in the tissue, which reduces stiffness and tightness. This medicine may be used for other purposes; ask your health care provider or pharmacist if you have questions. COMMON BRAND NAME(S): Xiaflex What should I tell my care team before I take this medication? They need to know if you have any of these conditions: Bleeding disorder An unusual or allergic reaction to collagenase, other medications, foods, dyes, or preservatives Pregnant or trying to get pregnant Breast-feeding How should I use this medication? This medication is injected into the affected area. It is given by your care team in a hospital or clinic setting. A special MedGuide will be given to you by the pharmacist with each prescription and refill. Be sure to read this information carefully each time. Talk to your care team about the use of this medication in children. Special care may be needed. Overdosage: If you think you have taken too much of this medicine contact a poison control center or emergency room at once. NOTE: This medicine is only for you. Do not share this medicine with others. What if I miss a dose? Keep appointments for follow-up doses. It is important not to miss your dose. Call your care team if you are unable to keep an appointment. What may interact with this medication? Aspirin and aspirin-like medications Certain medications that treat or prevent blood clots, such as warfarin, enoxaparin, dalteparin, apixaban, dabigatran, rivaroxaban This list may not describe all possible interactions. Give your health care provider a list of all the medicines, herbs, non-prescription drugs, or dietary supplements you use. Also tell them if you smoke, drink alcohol, or use illegal drugs. Some items may  interact with your medicine. What should I watch for while using this medication? Your condition will be monitored carefully while you are receiving this medication. If medication is for Dupuytren's Contracture, visit your care team 1 to 3 days after the injection. Until you visit your care team, do not flex or extend the fingers of your hand that was injected. Do not touch your finger that was injected. Elevate your hand until bedtime. Do not perform activity with the injected hand until you are told that it is ok. Follow any instructions about wearing a splint or performing finger exercises. Contact your care team as soon as possible if you get increasing redness or swelling in the hand, have numbness or tingling in the treated finger, or have trouble bending the finger after the swelling goes down. If medication is for Peyronie's disease, do not have sex between the first and second injections. Wait 4 weeks after the second injection and when there is no more pain or swelling in the penis to have sex. Avoid using vacuum erection devices during treatment with this medication. Try to avoid straining stomach muscles such as during bowel movements. Your care team will give you instructions on how to perform modeling activities at home. Contact your care team as soon as possible if you have severe pain or swelling in the penis, severe purple bruising and swelling of the penis, trouble passing urine, blood in urine, popping or cracking sound form the penis, or sudden loss of ability to maintain an erection. What side effects may I notice from receiving this medication? Side effects that you should report to your care team as soon as possible: Allergic reactions--skin rash,  itching, hives, swelling of the face, lips, tongue, or throat Feeling faint or lightheaded Skin infection--skin redness, swelling, warmth, or pain Severe back pain, chest pain, headache, trouble breathing after injection Snap or pop that  you feel or hear, severe pain, numbness, swelling, or bruising of or trouble moving in area where injected Side effects that usually do not require medical attention (report to your care team if they continue or are bothersome): Pain, redness, or irritation at injection site This list may not describe all possible side effects. Call your doctor for medical advice about side effects. You may report side effects to FDA at 1-800-FDA-1088. Where should I keep my medication? This medication is given in a hospital or clinic. It will not be stored at home. NOTE: This sheet is a summary. It may not cover all possible information. If you have questions about this medicine, talk to your doctor, pharmacist, or health care provider.  2024 Elsevier/Gold Standard (2021-08-13 00:00:00)

## 2023-11-28 NOTE — Progress Notes (Signed)
 Xiaflex second injection:  Injection procedure:  Using sterile technique an alcohol swab was used to clean the area of injection. The location had been previously marked. A 27-gauge needle was inserted through the skin and into the plaque at the point of maximum concavity with the needle oriented perpendicular to the corpus cavernosum. The needle was advanced transversely through the width of the plaque toward the opposite side of the plaque without passing completely through it. Proper position was confirmed by noting resistance to minimal depression of the syringe plunger. I then began injecting maintaining steady pressure as I withdrew the needle slowly through the transverse width of the plaque depositing  0.25 cc  of Xiaflex solution 0cc wastage. The needle was then withdrawn and gentle pressure was applied at the injection site.   He tolerated his injection well. I will therefore have him return in 48 hours for penile modeling.

## 2023-12-04 ENCOUNTER — Ambulatory Visit: Admitting: Urology

## 2024-01-08 ENCOUNTER — Ambulatory Visit: Admitting: Urology

## 2024-01-10 ENCOUNTER — Ambulatory Visit: Admitting: Urology

## 2024-02-22 ENCOUNTER — Encounter (HOSPITAL_COMMUNITY): Payer: Self-pay | Admitting: *Deleted
